# Patient Record
Sex: Female | Born: 1935 | Race: Black or African American | Hispanic: No | Smoking: Never smoker
Health system: Southern US, Community
[De-identification: ages and names within clinical notes are randomized; demographics above are authoritative.]

## PROBLEM LIST (undated history)

## (undated) DIAGNOSIS — I10 Essential (primary) hypertension: Secondary | ICD-10-CM

## (undated) DIAGNOSIS — I4510 Unspecified right bundle-branch block: Secondary | ICD-10-CM

## (undated) DIAGNOSIS — R011 Cardiac murmur, unspecified: Secondary | ICD-10-CM

## (undated) DIAGNOSIS — I2510 Atherosclerotic heart disease of native coronary artery without angina pectoris: Secondary | ICD-10-CM

## (undated) HISTORY — DX: Atherosclerotic heart disease of native coronary artery without angina pectoris: I25.10

## (undated) HISTORY — DX: Cardiac murmur, unspecified: R01.1

## (undated) HISTORY — DX: Unspecified right bundle-branch block: I45.10

## (undated) HISTORY — DX: Essential (primary) hypertension: I10

---
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
## 1998-11-01 ENCOUNTER — Ambulatory Visit (HOSPITAL_COMMUNITY): Admission: RE | Admit: 1998-11-01 | Discharge: 1998-11-01 | Payer: Self-pay | Admitting: Internal Medicine

## 1998-11-01 ENCOUNTER — Encounter: Payer: Self-pay | Admitting: Internal Medicine

## 1999-12-23 ENCOUNTER — Emergency Department (HOSPITAL_COMMUNITY): Admission: EM | Admit: 1999-12-23 | Discharge: 1999-12-23 | Payer: Self-pay | Admitting: Emergency Medicine

## 1999-12-23 ENCOUNTER — Encounter: Payer: Self-pay | Admitting: Emergency Medicine

## 2005-03-26 ENCOUNTER — Ambulatory Visit (HOSPITAL_COMMUNITY): Admission: RE | Admit: 2005-03-26 | Discharge: 2005-03-26 | Payer: Self-pay | Admitting: Orthopedic Surgery

## 2007-03-31 ENCOUNTER — Encounter: Admission: RE | Admit: 2007-03-31 | Discharge: 2007-03-31 | Payer: Self-pay | Admitting: Orthopedic Surgery

## 2007-04-10 ENCOUNTER — Encounter: Admission: RE | Admit: 2007-04-10 | Discharge: 2007-04-10 | Payer: Self-pay | Admitting: Orthopedic Surgery

## 2008-05-03 ENCOUNTER — Encounter: Admission: RE | Admit: 2008-05-03 | Discharge: 2008-05-03 | Payer: Self-pay | Admitting: Orthopedic Surgery

## 2010-11-19 ENCOUNTER — Encounter: Admission: RE | Admit: 2010-11-19 | Discharge: 2010-11-19 | Payer: Self-pay | Admitting: Internal Medicine

## 2012-02-03 ENCOUNTER — Other Ambulatory Visit: Payer: Self-pay | Admitting: Internal Medicine

## 2012-02-03 DIAGNOSIS — Z1231 Encounter for screening mammogram for malignant neoplasm of breast: Secondary | ICD-10-CM

## 2012-02-25 ENCOUNTER — Ambulatory Visit
Admission: RE | Admit: 2012-02-25 | Discharge: 2012-02-25 | Disposition: A | Discharge status: Home / Self Care | Payer: Medicare Other | Source: Ambulatory Visit | Attending: Internal Medicine | Admitting: Internal Medicine

## 2012-02-25 DIAGNOSIS — Z1231 Encounter for screening mammogram for malignant neoplasm of breast: Secondary | ICD-10-CM

## 2013-02-08 ENCOUNTER — Other Ambulatory Visit: Payer: Self-pay | Admitting: Internal Medicine

## 2013-02-08 DIAGNOSIS — Z1231 Encounter for screening mammogram for malignant neoplasm of breast: Secondary | ICD-10-CM

## 2013-03-17 ENCOUNTER — Ambulatory Visit: Payer: Medicare Other

## 2013-04-20 ENCOUNTER — Ambulatory Visit
Admission: RE | Admit: 2013-04-20 | Discharge: 2013-04-20 | Disposition: A | Discharge status: Home / Self Care | Payer: Self-pay | Source: Ambulatory Visit | Attending: Internal Medicine | Admitting: Internal Medicine

## 2013-04-20 DIAGNOSIS — Z1231 Encounter for screening mammogram for malignant neoplasm of breast: Secondary | ICD-10-CM

## 2013-08-10 ENCOUNTER — Encounter: Payer: Self-pay | Admitting: Cardiovascular Disease

## 2013-08-10 ENCOUNTER — Ambulatory Visit (INDEPENDENT_AMBULATORY_CARE_PROVIDER_SITE_OTHER): Payer: Medicare PPO | Admitting: Cardiovascular Disease

## 2013-08-10 VITALS — BP 134/76 | HR 55 | Ht 63.0 in | Wt 194.5 lb

## 2013-08-10 DIAGNOSIS — R001 Bradycardia, unspecified: Secondary | ICD-10-CM

## 2013-08-10 DIAGNOSIS — I498 Other specified cardiac arrhythmias: Secondary | ICD-10-CM

## 2013-08-10 NOTE — Progress Notes (Signed)
Patient ID: Allison Mills, female   DOB: [birth year], 79 y.o.   MRN: [id number]     Reason for office visit Followup bradycardia  Mrs. Soman is an elderly lady with sinus bradycardia chronic right bundle branch block. Roughly 2 years ago her heart rate was in the 40s and a recommended that she consider a pacemaker. She prefers to treat her condition with observation and she has not developed syncope or other symptoms of bradycardia. Her heart rate has generally been in the high 50s and low 60s. Today her heart rate is 55 beats per minute. She remains in sinus bradycardia. She has been started on eyedrops for glaucoma. She takes antihypertensive medications but is otherwise remarkably healthy. She has had some left shoulder pain that occasionally radiates to her chest, but is clearly associated with certain positions and motions and is musculoskeletal in etiology.  No Known Allergies  Current Outpatient Prescriptions  Medication Sig Dispense Refill  . brimonidine (ALPHAGAN P) 0.1 % SOLN Place 1 drop into both eyes 3 (three) times daily.      . brinzolamide (AZOPT) 1 % ophthalmic suspension Place 1 drop into both eyes 3 (three) times daily.      Marland Kitchen losartan-hydrochlorothiazide (HYZAAR) 100-12.5 MG per tablet Take 1 tablet by mouth daily.       No current facility-administered medications for this visit.    Past Medical History  Diagnosis Date  . RBBB (right bundle branch block)   . Coronary artery disease   . Hypertension   . Murmur     No past surgical history on file.  No family history on file.  History   Social History  . Marital Status: Widowed    Spouse Name: N/A    Number of Children: N/A  . Years of Education: N/A   Occupational History  . Not on file.   Social History Main Topics  . Smoking status: Never Smoker   . Smokeless tobacco: Never Used  . Alcohol Use: No  . Drug Use: No  . Sexual Activity: Not on file   Other Topics Concern  . Not on file    Social History Narrative  . No narrative on file    Review of systems: The patient specifically denies any chest pain at rest or with exertion, dyspnea at rest or with exertion, orthopnea, paroxysmal nocturnal dyspnea, syncope, palpitations, focal neurological deficits, intermittent claudication, lower extremity edema, unexplained weight gain, cough, hemoptysis or wheezing.  The patient also denies abdominal pain, nausea, vomiting, dysphagia, diarrhea, constipation, polyuria, polydipsia, dysuria, hematuria, frequency, urgency, abnormal bleeding or bruising, fever, chills, unexpected weight changes, mood swings, change in skin or hair texture, change in voice quality, auditory or visual problems, allergic reactions or rashes, new musculoskeletal complaints other than usual "aches and pains".   PHYSICAL EXAM BP 134/76  Pulse 55  Ht 5\' 3"  (1.6 m)  Wt 194 lb 8 oz (88.225 kg)  BMI 34.46 kg/m2  General: Alert, oriented x3, no distress Head: no evidence of trauma, PERRL, EOMI, no exophtalmos or lid lag, no myxedema, no xanthelasma; normal ears, nose and oropharynx Neck: normal jugular venous pulsations and no hepatojugular reflux; brisk carotid pulses without delay and no carotid bruits Chest: clear to auscultation, no signs of consolidation by percussion or palpation, normal fremitus, symmetrical and full respiratory excursions Cardiovascular: normal position and quality of the apical impulse, regular rhythm, normal first and second heart sounds, no rubs or gallops, early peaking grade 1/6 aortic ejection murmur Abdomen: no  tenderness or distention, no masses by palpation, no abnormal pulsatility or arterial bruits, normal bowel sounds, no hepatosplenomegaly Extremities: no clubbing, cyanosis or edema; 2+ radial, ulnar and brachial pulses bilaterally; 2+ right femoral, posterior tibial and dorsalis pedis pulses; 2+ left femoral, posterior tibial and dorsalis pedis pulses; no subclavian or  femoral bruits Neurological: grossly nonfocal   EKG: Sinus bradycardia, right bundle branch block (QRS 120 ms)   ASSESSMENT AND PLAN No problem-specific assessment & plan notes found for this encounter.  Orders Placed This Encounter  Procedures  . EKG 12-Lead   Meds ordered this encounter  Medications  . losartan-hydrochlorothiazide (HYZAAR) 100-12.5 MG per tablet    Sig: Take 1 tablet by mouth daily.  . brinzolamide (AZOPT) 1 % ophthalmic suspension    Sig: Place 1 drop into both eyes 3 (three) times daily.  . brimonidine (ALPHAGAN P) 0.1 % SOLN    Sig: Place 1 drop into both eyes 3 (three) times daily.    Junious Silk, MD, [facility] [phone] office [phone] pager

## 2013-08-10 NOTE — Patient Instructions (Addendum)
Your physician recommends that you schedule a follow-up appointment as needed if you develop loss of consciousness, dizziness or weakness.

## 2013-08-22 DIAGNOSIS — R001 Bradycardia, unspecified: Secondary | ICD-10-CM | POA: Insufficient documentation

## 2013-08-22 NOTE — Assessment & Plan Note (Signed)
Although there is convincing evidence of sinus node dysfunction and intraventricular conduction disease, Allison Mills and has not had any symptoms to suggest the need for pacemaker therapy. I have asked her to call me if she develops presyncope or syncope or if she develops marked fatigue or dyspnea. Otherwise I agree with her decision to monitor her condition without aggressive intervention.

## 2015-01-10 DIAGNOSIS — H4011X3 Primary open-angle glaucoma, severe stage: Secondary | ICD-10-CM | POA: Diagnosis not present

## 2015-01-10 DIAGNOSIS — H2512 Age-related nuclear cataract, left eye: Secondary | ICD-10-CM | POA: Diagnosis not present

## 2015-02-28 DIAGNOSIS — M150 Primary generalized (osteo)arthritis: Secondary | ICD-10-CM | POA: Diagnosis not present

## 2015-02-28 DIAGNOSIS — I10 Essential (primary) hypertension: Secondary | ICD-10-CM | POA: Diagnosis not present

## 2015-05-02 ENCOUNTER — Other Ambulatory Visit: Payer: Self-pay

## 2015-05-02 DIAGNOSIS — Z1231 Encounter for screening mammogram for malignant neoplasm of breast: Secondary | ICD-10-CM

## 2015-05-25 ENCOUNTER — Ambulatory Visit
Admission: RE | Admit: 2015-05-25 | Discharge: 2015-05-25 | Disposition: A | Discharge status: Home / Self Care | Payer: Medicare PPO | Source: Ambulatory Visit

## 2015-05-25 DIAGNOSIS — Z1231 Encounter for screening mammogram for malignant neoplasm of breast: Secondary | ICD-10-CM

## 2015-06-27 DIAGNOSIS — R739 Hyperglycemia, unspecified: Secondary | ICD-10-CM | POA: Diagnosis not present

## 2015-06-27 DIAGNOSIS — E559 Vitamin D deficiency, unspecified: Secondary | ICD-10-CM | POA: Diagnosis not present

## 2015-06-27 DIAGNOSIS — M150 Primary generalized (osteo)arthritis: Secondary | ICD-10-CM | POA: Diagnosis not present

## 2015-06-27 DIAGNOSIS — I10 Essential (primary) hypertension: Secondary | ICD-10-CM | POA: Diagnosis not present

## 2015-06-27 DIAGNOSIS — M2550 Pain in unspecified joint: Secondary | ICD-10-CM | POA: Diagnosis not present

## 2015-07-11 DIAGNOSIS — H16223 Keratoconjunctivitis sicca, not specified as Sjogren's, bilateral: Secondary | ICD-10-CM | POA: Diagnosis not present

## 2015-07-11 DIAGNOSIS — H43813 Vitreous degeneration, bilateral: Secondary | ICD-10-CM | POA: Diagnosis not present

## 2015-07-11 DIAGNOSIS — H47323 Drusen of optic disc, bilateral: Secondary | ICD-10-CM | POA: Diagnosis not present

## 2015-07-11 DIAGNOSIS — H4011X3 Primary open-angle glaucoma, severe stage: Secondary | ICD-10-CM | POA: Diagnosis not present

## 2015-08-01 DIAGNOSIS — I10 Essential (primary) hypertension: Secondary | ICD-10-CM | POA: Diagnosis not present

## 2015-08-01 DIAGNOSIS — M150 Primary generalized (osteo)arthritis: Secondary | ICD-10-CM | POA: Diagnosis not present

## 2015-11-28 DIAGNOSIS — I10 Essential (primary) hypertension: Secondary | ICD-10-CM | POA: Diagnosis not present

## 2015-11-28 DIAGNOSIS — M150 Primary generalized (osteo)arthritis: Secondary | ICD-10-CM | POA: Diagnosis not present

## 2015-11-28 DIAGNOSIS — M1990 Unspecified osteoarthritis, unspecified site: Secondary | ICD-10-CM | POA: Diagnosis not present

## 2015-11-28 DIAGNOSIS — Z23 Encounter for immunization: Secondary | ICD-10-CM | POA: Diagnosis not present

## 2015-11-28 DIAGNOSIS — E559 Vitamin D deficiency, unspecified: Secondary | ICD-10-CM | POA: Diagnosis not present

## 2015-11-28 DIAGNOSIS — M154 Erosive (osteo)arthritis: Secondary | ICD-10-CM | POA: Diagnosis not present

## 2016-07-23 ENCOUNTER — Other Ambulatory Visit: Payer: Self-pay | Admitting: Internal Medicine

## 2016-07-23 DIAGNOSIS — Z1231 Encounter for screening mammogram for malignant neoplasm of breast: Secondary | ICD-10-CM

## 2016-08-01 ENCOUNTER — Ambulatory Visit
Admission: RE | Admit: 2016-08-01 | Discharge: 2016-08-01 | Disposition: A | Discharge status: Home / Self Care | Payer: Medicare Other | Source: Ambulatory Visit | Attending: Internal Medicine | Admitting: Internal Medicine

## 2016-08-01 DIAGNOSIS — Z1231 Encounter for screening mammogram for malignant neoplasm of breast: Secondary | ICD-10-CM

## 2017-05-28 LAB — HM DIABETES EYE EXAM

## 2018-02-03 LAB — HM DIABETES EYE EXAM

## 2018-09-02 LAB — HM DIABETES EYE EXAM

## 2018-09-14 ENCOUNTER — Ambulatory Visit
Admission: RE | Admit: 2018-09-14 | Discharge: 2018-09-14 | Disposition: A | Discharge status: Home / Self Care | Payer: Medicare Other | Source: Ambulatory Visit | Attending: Internal Medicine | Admitting: Internal Medicine

## 2018-09-14 ENCOUNTER — Other Ambulatory Visit: Payer: Self-pay | Admitting: Internal Medicine

## 2018-09-14 DIAGNOSIS — G8929 Other chronic pain: Secondary | ICD-10-CM

## 2018-09-14 DIAGNOSIS — M25552 Pain in left hip: Secondary | ICD-10-CM

## 2018-09-14 DIAGNOSIS — M533 Sacrococcygeal disorders, not elsewhere classified: Secondary | ICD-10-CM

## 2018-09-14 DIAGNOSIS — M898X5 Other specified disorders of bone, thigh: Secondary | ICD-10-CM

## 2018-09-14 DIAGNOSIS — M79605 Pain in left leg: Secondary | ICD-10-CM

## 2018-09-24 ENCOUNTER — Telehealth: Payer: Self-pay | Admitting: Internal Medicine

## 2018-09-24 NOTE — Telephone Encounter (Signed)
I spoke with her daughter today.

## 2018-10-19 ENCOUNTER — Telehealth: Payer: Self-pay | Admitting: Internal Medicine

## 2018-10-19 NOTE — Telephone Encounter (Signed)
I left a message with the patient's daughter asking her to come at 11:30 instead of 11:15 on 11/04/18. VDM (DD)

## 2018-11-04 ENCOUNTER — Ambulatory Visit: Payer: Self-pay | Admitting: Internal Medicine

## 2018-11-04 ENCOUNTER — Ambulatory Visit (INDEPENDENT_AMBULATORY_CARE_PROVIDER_SITE_OTHER): Payer: Medicare Other | Admitting: Internal Medicine

## 2018-11-04 ENCOUNTER — Encounter: Payer: Self-pay | Admitting: Internal Medicine

## 2018-11-04 ENCOUNTER — Ambulatory Visit: Payer: Medicare Other

## 2018-11-04 VITALS — BP 130/70 | HR 56 | Temp 97.9°F | Ht 61.0 in | Wt 150.2 lb

## 2018-11-04 DIAGNOSIS — Z23 Encounter for immunization: Secondary | ICD-10-CM | POA: Diagnosis not present

## 2018-11-04 DIAGNOSIS — E559 Vitamin D deficiency, unspecified: Secondary | ICD-10-CM | POA: Diagnosis not present

## 2018-11-04 DIAGNOSIS — I10 Essential (primary) hypertension: Secondary | ICD-10-CM

## 2018-11-04 DIAGNOSIS — Z0000 Encounter for general adult medical examination without abnormal findings: Secondary | ICD-10-CM | POA: Diagnosis not present

## 2018-11-04 DIAGNOSIS — R635 Abnormal weight gain: Secondary | ICD-10-CM

## 2018-11-04 NOTE — Patient Instructions (Signed)
[name] , Thank you for taking time to come for your Medicare Wellness Visit. I appreciate your ongoing commitment to your health goals. Please review the following plan we discussed and let me know if I can assist you in the future.   Screening recommendations/referrals: Colonoscopy: not required Mammogram: not required Bone Density: 10/2010 Recommended yearly ophthalmology/optometry visit for glaucoma screening and checkup Recommended yearly dental visit for hygiene and checkup  Vaccinations: Influenza vaccine: today Pneumococcal vaccine: checking with pharmacy Tdap vaccine: dec Shingles vaccine: dec    Advanced directives: Advance directive discussed with you today. I have provided a copy for you to complete at home and have notarized. Once this is complete please bring a copy in to our office so we can scan it into your chart.   Conditions/risks identified: Overweight  Next appointment: 03/10/2019 at 11:00   Preventive Care 65 Years and Older, Female Preventive care refers to lifestyle choices and visits with your health care provider that can promote health and wellness. What does preventive care include?  A yearly physical exam. This is also called an annual well check.  Dental exams once or twice a year.  Routine eye exams. Ask your health care provider how often you should have your eyes checked.  Personal lifestyle choices, including:  Daily care of your teeth and gums.  Regular physical activity.  Eating a healthy diet.  Avoiding tobacco and drug use.  Limiting alcohol use.  Practicing safe sex.  Taking low-dose aspirin every day.  Taking vitamin and mineral supplements as recommended by your health care provider. What happens during an annual well check? The services and screenings done by your health care provider during your annual well check will depend on your age, overall health, lifestyle risk factors, and family history of disease. Counseling    Your health care provider may ask you questions about your:  Alcohol use.  Tobacco use.  Drug use.  Emotional well-being.  Home and relationship well-being.  Sexual activity.  Eating habits.  History of falls.  Memory and ability to understand (cognition).  Work and work Astronomerenvironment.  Reproductive health. Screening  You may have the following tests or measurements:  Height, weight, and BMI.  Blood pressure.  Lipid and cholesterol levels. These may be checked every 5 years, or more frequently if you are over 82 years old.  Skin check.  Lung cancer screening. You may have this screening every year starting at age 82 if you have a 30-pack-year history of smoking and currently smoke or have quit within the past 15 years.  Fecal occult blood test (FOBT) of the stool. You may have this test every year starting at age 82.  Flexible sigmoidoscopy or colonoscopy. You may have a sigmoidoscopy every 5 years or a colonoscopy every 10 years starting at age 82.  Hepatitis C blood test.  Hepatitis B blood test.  Sexually transmitted disease (STD) testing.  Diabetes screening. This is done by checking your blood sugar (glucose) after you have not eaten for a while (fasting). You may have this done every 1-3 years.  Bone density scan. This is done to screen for osteoporosis. You may have this done starting at age 82.  Mammogram. This may be done every 1-2 years. Talk to your health care provider about how often you should have regular mammograms. Talk with your health care provider about your test results, treatment options, and if necessary, the need for more tests. Vaccines  Your health care provider may recommend  certain vaccines, such as:  Influenza vaccine. This is recommended every year.  Tetanus, diphtheria, and acellular pertussis (Tdap, Td) vaccine. You may need a Td booster every 10 years.  Zoster vaccine. You may need this after age 9.  Pneumococcal 13-valent  conjugate (PCV13) vaccine. One dose is recommended after age 81.  Pneumococcal polysaccharide (PPSV23) vaccine. One dose is recommended after age 45. Talk to your health care provider about which screenings and vaccines you need and how often you need them. This information is not intended to replace advice given to you by your health care provider. Make sure you discuss any questions you have with your health care provider. Document Released: 01/04/2016 Document Revised: 08/27/2016 Document Reviewed: 10/09/2015 Elsevier Interactive Patient Education  2017 [employer] Prevention in the Home Falls can cause injuries. They can happen to people of all ages. There are many things you can do to make your home safe and to help prevent falls. What can I do on the outside of my home?  Regularly fix the edges of walkways and driveways and fix any cracks.  Remove anything that might make you trip as you walk through a door, such as a raised step or threshold.  Trim any bushes or trees on the path to your home.  Use bright outdoor lighting.  Clear any walking paths of anything that might make someone trip, such as rocks or tools.  Regularly check to see if handrails are loose or broken. Make sure that both sides of any steps have handrails.  Any raised decks and porches should have guardrails on the edges.  Have any leaves, snow, or ice cleared regularly.  Use sand or salt on walking paths during winter.  Clean up any spills in your garage right away. This includes oil or grease spills. What can I do in the bathroom?  Use night lights.  Install grab bars by the toilet and in the tub and shower. Do not use towel bars as grab bars.  Use non-skid mats or decals in the tub or shower.  If you need to sit down in the shower, use a plastic, non-slip stool.  Keep the floor dry. Clean up any water that spills on the floor as soon as it happens.  Remove soap buildup in the tub or  shower regularly.  Attach bath mats securely with double-sided non-slip rug tape.  Do not have throw rugs and other things on the floor that can make you trip. What can I do in the bedroom?  Use night lights.  Make sure that you have a light by your bed that is easy to reach.  Do not use any sheets or blankets that are too big for your bed. They should not hang down onto the floor.  Have a firm chair that has side arms. You can use this for support while you get dressed.  Do not have throw rugs and other things on the floor that can make you trip. What can I do in the kitchen?  Clean up any spills right away.  Avoid walking on wet floors.  Keep items that you use a lot in easy-to-reach places.  If you need to reach something above you, use a strong step stool that has a grab bar.  Keep electrical cords out of the way.  Do not use floor polish or wax that makes floors slippery. If you must use wax, use non-skid floor wax.  Do not have throw rugs and other  things on the floor that can make you trip. What can I do with my stairs?  Do not leave any items on the stairs.  Make sure that there are handrails on both sides of the stairs and use them. Fix handrails that are broken or loose. Make sure that handrails are as long as the stairways.  Check any carpeting to make sure that it is firmly attached to the stairs. Fix any carpet that is loose or worn.  Avoid having throw rugs at the top or bottom of the stairs. If you do have throw rugs, attach them to the floor with carpet tape.  Make sure that you have a light switch at the top of the stairs and the bottom of the stairs. If you do not have them, ask someone to add them for you. What else can I do to help prevent falls?  Wear shoes that:  Do not have high heels.  Have rubber bottoms.  Are comfortable and fit you well.  Are closed at the toe. Do not wear sandals.  If you use a stepladder:  Make sure that it is fully  opened. Do not climb a closed stepladder.  Make sure that both sides of the stepladder are locked into place.  Ask someone to hold it for you, if possible.  Clearly mark and make sure that you can see:  Any grab bars or handrails.  First and last steps.  Where the edge of each step is.  Use tools that help you move around (mobility aids) if they are needed. These include:  Canes.  Walkers.  Scooters.  Crutches.  Turn on the lights when you go into a dark area. Replace any light bulbs as soon as they burn out.  Set up your furniture so you have a clear path. Avoid moving your furniture around.  If any of your floors are uneven, fix them.  If there are any pets around you, be aware of where they are.  Review your medicines with your doctor. Some medicines can make you feel dizzy. This can increase your chance of falling. Ask your doctor what other things that you can do to help prevent falls. This information is not intended to replace advice given to you by your health care provider. Make sure you discuss any questions you have with your health care provider. Document Released: 10/04/2009 Document Revised: 05/15/2016 Document Reviewed: 01/12/2015 Elsevier Interactive Patient Education  2017 [employer].

## 2018-11-04 NOTE — Progress Notes (Signed)
Subjective:   Allison Mills is a 84 y.o. female who presents for an Initial Medicare Annual Wellness Visit.  Review of Systems    n/a  Cardiac Risk Factors include: advanced age (>22men, >24 women);hypertension     Objective:    Today's Vitals   11/04/18 1155  BP: 130/70  Pulse: (!) 56  Temp: 97.9 F (36.6 C)  SpO2: 99%  Weight: 150 lb 3.2 oz (68.1 kg)  Height: 5\' 1"  (1.549 m)  PainSc: 0-No pain   Body mass index is 28.38 kg/m.  Advanced Directives 11/04/2018  Does Patient Have a Medical Advance Directive? No  Would patient like information on creating a medical advance directive? Yes (MAU/Ambulatory/Procedural Areas - Information given)    Current Medications (verified) Outpatient Encounter Medications as of 11/04/2018  Medication Sig  . amLODipine (NORVASC) 5 MG tablet Take 5 mg by mouth daily.  . brimonidine (ALPHAGAN P) 0.1 % SOLN Place 1 drop into both eyes 3 (three) times daily.  . brinzolamide (AZOPT) 1 % ophthalmic suspension Place 1 drop into both eyes 3 (three) times daily.  Marland Kitchen latanoprost (XALATAN) 0.005 % ophthalmic solution 1 drop at bedtime.  . valsartan-hydrochlorothiazide (DIOVAN-HCT) 160-12.5 MG tablet Take 1 tablet by mouth daily.  Marland Kitchen losartan-hydrochlorothiazide (HYZAAR) 100-12.5 MG per tablet Take 1 tablet by mouth daily.   No facility-administered encounter medications on file as of 11/04/2018.     Allergies (verified) Patient has no known allergies.   History: Past Medical History:  Diagnosis Date  . Coronary artery disease   . Hypertension   . Murmur   . RBBB (right bundle branch block)    History reviewed. No pertinent surgical history. History reviewed. No pertinent family history. Social History   Socioeconomic History  . Marital status: Widowed    Spouse name: Not on file  . Number of children: Not on file  . Years of education: Not on file  . Highest education level: Not on file  Occupational History  . Occupation:  retired  Engineer, production  . Financial resource strain: Not hard at all  . Food insecurity:    Worry: Never true    Inability: Never true  . Transportation needs:    Medical: No    Non-medical: No  Tobacco Use  . Smoking status: Never Smoker  . Smokeless tobacco: Never Used  Substance and Sexual Activity  . Alcohol use: No  . Drug use: No  . Sexual activity: Not Currently  Lifestyle  . Physical activity:    Days per week: 3 days    Minutes per session: 30 min  . Stress: Not at all  Relationships  . Social connections:    Talks on phone: Not on file    Gets together: Not on file    Attends religious service: Not on file    Active member of club or organization: Not on file    Attends meetings of clubs or organizations: Not on file    Relationship status: Not on file  Other Topics Concern  . Not on file  Social History Narrative  . Not on file    Tobacco Counseling Counseling given: Not Answered   Clinical Intake:  Pre-visit preparation completed: Yes  Pain : No/denies pain Pain Score: 0-No pain     Nutritional Status: BMI 25 -29 Overweight Nutritional Risks: None Diabetes: No  How often do you need to have someone help you when you read instructions, pamphlets, or other written materials from your doctor or  pharmacy?: 1 - Never What is the last grade level you completed in school?: graduated high school  Interpreter Needed?: No  Information entered by :: NAllen LPN   Activities of Daily Living In your present state of health, do you have any difficulty performing the following activities: 11/04/2018  Hearing? Y  Comment working on getting hearing aides adjusted  Vision? N  Difficulty concentrating or making decisions? N  Walking or climbing stairs? N  Dressing or bathing? N  Doing errands, shopping? Y  Comment lives in independent living  Preparing Food and eating ? N  Using the Toilet? N  In the past six months, have you accidently leaked urine? N    Do you have problems with loss of bowel control? N  Managing your Medications? N  Managing your Finances? N  Housekeeping or managing your Housekeeping? N  Some recent data might be hidden     Immunizations and Health Maintenance Immunization History  Administered Date(s) Administered  . Influenza, High Dose Seasonal PF 11/04/2018   Health Maintenance Due  Topic Date Due  . TETANUS/TDAP  11-06-201938  . PNA vac Low Risk Adult (1 of 2 - PCV13) 11-06-201984    Patient Care Team: Dorothyann Peng, MD as PCP - General (Internal Medicine)  Indicate any recent Medical Services you may have received from other than [facility] providers in the past year (date may be approximate).     Assessment:   This is a routine wellness examination for Jewett.  Hearing/Vision screen Vision Screening Comments: Dr. Harlon Flor Annual eye exams  Dietary issues and exercise activities discussed: Current Exercise Habits: Structured exercise class, Type of exercise: walking;strength training/weights, Time (Minutes): 30, Frequency (Times/Week): 3, Weekly Exercise (Minutes/Week): 90, Intensity: Moderate, Exercise limited by: None identified  Goals    . DIET - REDUCE SALT INTAKE TO 2 GRAMS PER DAY OR LESS (pt-stated)     Does not want to eat salty foods      Depression Screen PHQ 2/9 Scores 11/04/2018  PHQ - 2 Score 0    Fall Risk Fall Risk  11/04/2018  Falls in the past year? 0  Risk for fall due to : Medication side effect    Is the patient's home free of loose throw rugs in walkways, pet beds, electrical cords, etc?   yes      Grab bars in the bathroom? yes      Handrails on the stairs?   yes      Adequate lighting?   yes  Timed Get Up and Go Performed n/a  Cognitive Function:     6CIT Screen 11/04/2018  What Year? 0 points  What month? 0 points  What time? 3 points  Count back from 20 0 points  Months in reverse 0 points  Repeat phrase 2 points  Total Score 5    Screening Tests Health  Maintenance  Topic Date Due  . TETANUS/TDAP  11-06-201938  . PNA vac Low Risk Adult (1 of 2 - PCV13) 11-06-201984  . INFLUENZA VACCINE  Completed  . DEXA SCAN  Completed    Qualifies for Shingles Vaccine? yes  Cancer Screenings: Lung: Low Dose CT Chest recommended if Age 58-80 years, 30 pack-year currently smoking OR have quit w/in 15years. Patient does not qualify. Breast: Up to date on Mammogram? Yes   Up to date of Bone Density/Dexa? Yes Colorectal: not required  Additional Screenings:  Hepatitis C Screening: n/a     Plan:    Patient states she does  not want to eat salty foods. Received flu vaccine today. Checking with pharmacy in regards to pneumonia vaccines.  I have personally reviewed and noted the following in the patient's chart:   . Medical and social history . Use of alcohol, tobacco or illicit drugs  . Current medications and supplements . Functional ability and status . Nutritional status . Physical activity . Advanced directives . List of other physicians . Hospitalizations, surgeries, and ER visits in previous 12 months . Vitals . Screenings to include cognitive, depression, and falls . Referrals and appointments  In addition, I have reviewed and discussed with patient certain preventive protocols, quality metrics, and best practice recommendations. A written personalized care plan for preventive services as well as general preventive health recommendations were provided to patient.     Barb Merinoickeah E Ananias Kolander, LPN   [id number]/14/2019

## 2018-11-04 NOTE — Progress Notes (Addendum)
Subjective:     Patient ID: Allison Mills , female    DOB: 1916-04-29 , 84 y.o.   MRN: [id number]   Chief Complaint  Patient presents with  . Hypertension    HPI Pt is here for HTN FU  She is now Living at [facility] facility and she has adjusted well, she gets 2 meals a day and help her clean her home. Her daughter who is with her was concerned about pt loosing weight a few months ago, but pt states she eats well at her new facility. Has been doing PT 2-3 / week and then she goes on her own to the gym to do her exercises. She still has to use her walker, but her pain is better.    Past Medical History:  Diagnosis Date  . Coronary artery disease   . Hypertension   . Murmur   . RBBB (right bundle branch block)      No family history on file.   Current Outpatient Medications:  .  amLODipine (NORVASC) 5 MG tablet, Take 5 mg by mouth daily., Disp: , Rfl:  .  brimonidine (ALPHAGAN P) 0.1 % SOLN, Place 1 drop into both eyes 3 (three) times daily., Disp: , Rfl:  .  brinzolamide (AZOPT) 1 % ophthalmic suspension, Place 1 drop into both eyes 3 (three) times daily., Disp: , Rfl:  .  latanoprost (XALATAN) 0.005 % ophthalmic solution, 1 drop at bedtime., Disp: , Rfl:  .  losartan-hydrochlorothiazide (HYZAAR) 100-12.5 MG per tablet, Take 1 tablet by mouth daily., Disp: , Rfl:  .  valsartan-hydrochlorothiazide (DIOVAN-HCT) 160-12.5 MG tablet, Take 1 tablet by mouth daily., Disp: , Rfl:    No Known Allergies   Review of Systems  Constitutional: Negative for activity change, appetite change, diaphoresis, fatigue and fever.  Eyes: Negative for discharge.  Respiratory: Negative for shortness of breath.   Cardiovascular: Positive for leg swelling. Negative for chest pain and palpitations.       Gets ankle swelling at the end of the day  Gastrointestinal: Negative for abdominal distention, abdominal pain, constipation, diarrhea and nausea.  Endocrine: Negative for polydipsia and polyphagia.   Genitourinary: Negative for dysuria, enuresis, frequency and urgency.  Musculoskeletal: Positive for arthralgias and gait problem.       See HPI  Skin: Negative for rash.  Neurological: Negative for dizziness, weakness and headaches.  Psychiatric/Behavioral: Negative for sleep disturbance.     Today's Vitals   11/04/18 1240  BP: 130/70  Pulse: (!) 56  Temp: 97.9 F (36.6 C)  TempSrc: Oral  SpO2: 99%  Weight: 150 lb 3.2 oz (68.1 kg)  Height: 5\' 1"  (1.549 m)   Body mass index is 28.38 kg/m.   Objective:  Physical Exam   Constitutional: She is oriented to person, place, and time. She appears well-developed and well-nourished. No distress.  HENT:  Head: Normocephalic and atraumatic.  Right Ear: External ear normal.  Left Ear: External ear normal.  Nose: Nose normal.  Eyes: Conjunctivae are normal. Right eye exhibits no discharge. Left eye exhibits no discharge. No scleral icterus.  Neck: Neck supple. No thyromegaly present.  No carotid bruits bilaterally  Cardiovascular: Normal rate and regular rhythm.  No murmur heard. Pulmonary/Chest: Effort normal and breath sounds normal. No respiratory distress.  Musculoskeletal: Normal range of motion. She exhibits +1/4 pitting edema of her L ankle laterally.  Lymphadenopathy:    She has no cervical adenopathy.  Neurological: She is alert and oriented to person, place, and time.  Skin: Skin is warm and dry. Capillary refill takes less than 2 seconds. No rash noted. She is not diaphoretic.  Psychiatric: She has a normal mood and affect. Her behavior is normal. Judgment and thought content normal.  Nursing note reviewed.     Assessment And Plan:     1. Vitamin D deficiency chronic - Vitamin D 1,25 dihydroxy  2. Essential hypertension- stable, may continue same meds - CBC no Diff - CMP14 + Anion Gap - Lipid Profile - TSH - T3, free - T4, Free    FU in 3 months.   Shelma Eiben RODRIGUEZ-SOUTHWORTH, PA-C

## 2018-11-05 LAB — T3, FREE: T3 FREE: 2.2 pg/mL (ref 2.0–4.4)

## 2018-11-05 LAB — LIPID PANEL
CHOLESTEROL TOTAL: 146 mg/dL (ref 100–199)
Chol/HDL Ratio: 2.1 ratio (ref 0.0–4.4)
HDL: 68 mg/dL (ref >39)
LDL Calculated: 66 mg/dL (ref 0–99)
TRIGLYCERIDES: 58 mg/dL (ref 0–149)
VLDL CHOLESTEROL CAL: 12 mg/dL (ref 5–40)

## 2018-11-05 LAB — CMP14 + ANION GAP
ALK PHOS: 58 IU/L (ref 39–117)
ALT: 14 IU/L (ref 0–32)
ANION GAP: 13 mmol/L (ref 10.0–18.0)
AST: 14 IU/L (ref 0–40)
Albumin/Globulin Ratio: 1.5 (ref 1.2–2.2)
Albumin: 3.7 g/dL (ref 3.2–4.6)
BILIRUBIN TOTAL: 0.4 mg/dL (ref 0.0–1.2)
BUN/Creatinine Ratio: 20 (ref 12–28)
BUN: 16 mg/dL (ref 10–36)
CHLORIDE: 101 mmol/L (ref 96–106)
CO2: 26 mmol/L (ref 20–29)
CREATININE: 0.8 mg/dL (ref 0.57–1.00)
Calcium: 9.8 mg/dL (ref 8.7–10.3)
GFR calc Af Amer: 70 mL/min/{1.73_m2} (ref >59)
GFR calc non Af Amer: 61 mL/min/{1.73_m2} (ref >59)
GLUCOSE: 91 mg/dL (ref 65–99)
Globulin, Total: 2.4 g/dL (ref 1.5–4.5)
Potassium: 3.9 mmol/L (ref 3.5–5.2)
Sodium: 140 mmol/L (ref 134–144)
Total Protein: 6.1 g/dL (ref 6.0–8.5)

## 2018-11-05 LAB — CBC
HEMATOCRIT: 36.4 % (ref 34.0–46.6)
Hemoglobin: 12.3 g/dL (ref 11.1–15.9)
MCH: 31 pg (ref 26.6–33.0)
MCHC: 33.8 g/dL (ref 31.5–35.7)
MCV: 92 fL (ref 79–97)
Platelets: 218 10*3/uL (ref 150–450)
RBC: 3.97 x10E6/uL (ref 3.77–5.28)
RDW: 13 % (ref 12.3–15.4)
WBC: 4 10*3/uL (ref 3.4–10.8)

## 2018-11-05 LAB — TSH: TSH: 2.61 u[IU]/mL (ref 0.450–4.500)

## 2018-11-05 LAB — VITAMIN D 25 HYDROXY (VIT D DEFICIENCY, FRACTURES): Vit D, 25-Hydroxy: 38.9 ng/mL (ref 30.0–100.0)

## 2018-11-22 ENCOUNTER — Other Ambulatory Visit: Payer: Self-pay | Admitting: Internal Medicine

## 2018-11-27 LAB — SPECIMEN STATUS REPORT

## 2018-11-27 LAB — T4, FREE: Free T4: 1.33 ng/dL (ref 0.82–1.77)

## 2019-03-03 ENCOUNTER — Encounter: Payer: Self-pay | Admitting: Internal Medicine

## 2019-03-03 ENCOUNTER — Ambulatory Visit: Payer: Medicare Other | Admitting: Internal Medicine

## 2019-03-03 ENCOUNTER — Other Ambulatory Visit: Payer: Self-pay

## 2019-03-03 VITALS — BP 132/70 | HR 55 | Temp 98.1°F | Ht 65.0 in | Wt 168.8 lb

## 2019-03-03 DIAGNOSIS — G8929 Other chronic pain: Secondary | ICD-10-CM

## 2019-03-03 DIAGNOSIS — N182 Chronic kidney disease, stage 2 (mild): Secondary | ICD-10-CM | POA: Diagnosis not present

## 2019-03-03 DIAGNOSIS — R600 Localized edema: Secondary | ICD-10-CM

## 2019-03-03 DIAGNOSIS — M25562 Pain in left knee: Secondary | ICD-10-CM | POA: Diagnosis not present

## 2019-03-03 DIAGNOSIS — I129 Hypertensive chronic kidney disease with stage 1 through stage 4 chronic kidney disease, or unspecified chronic kidney disease: Secondary | ICD-10-CM | POA: Diagnosis not present

## 2019-03-03 MED ORDER — AMLODIPINE BESYLATE 5 MG PO TABS: 5.0000 mg | ORAL_TABLET | Freq: Every day | ORAL | 1 refills | Status: DC

## 2019-03-03 NOTE — Patient Instructions (Signed)
Understanding Your Risk for Falls Each year, millions of people suffer serious injuries from falls. It is important to understand your risk for falling. Talk with your health care provider about your risk and what you can do to lower it. There are actions you can take at home to lower your risk. If you do have a serious fall, it is important to tell your health care provider. Falling once raises your risk for falling again. How can falls affect me? Serious injuries from falls are common. These include:  Broken bones. Most hip fractures are caused by falls.  Traumatic brain injury (TBI). Falls are the most common cause of TBI. Fear of falling can also cause you to avoid activities and stay at home. This can make your muscles weaker and actually raise your risk for a fall. What can increase my risk? Serious injuries from a fall most often happen to people older than age 65. Children and young adults ages 15-29 are also at higher risk. The more risk factors you have for falling, the higher your risk. Risk factors include:  Weakness in the lower body.  Lack (deficiency) of vitamin D.  Weak bones (osteoporosis).  Being generally weak or confused due to long-term (chronic) illness.  Dizziness or balance problems.  Poor vision.  Having depression.  Medicine that causes dizziness or drowsiness. These can include medicines for your blood pressure, heart, anxiety, insomnia, or edema, as well as pain medicines and muscle relaxants.  Drinking alcohol.  Foot pain or improper footwear.  Working at a dangerous job.  Having had a fall in the past.  Tripping hazards at home, such as floor clutter or loose rugs, or poor lighting.  Having pets or clutter in your home. What actions can I take to lower my risk of falling?      Maintain physical fitness: ? Do strength and balance exercises. Consider taking a regular class to build strength and balance. Yoga and tai chi are good options. ?  Have your eyes checked every year and your vision prescription updated as needed.  Remove all clutter from walkways and stairways, including extension cords.  Use a cordless phone.  Do not use throw rugs. Make sure all carpeting is taped or tacked down securely.  Use good lighting in all rooms. Keep a flashlight near your bed.  Make sure there is a clear path from your bed to the bathroom. Use night-lights.  Install grab bars for your tub, shower, and toilet. Use a bath mat in your tub or shower.  Attach secure railings on both sides of your stairs.  Repair uneven or broken steps.  Use a cane or walker as directed by your health care provider.  Wear nonskid shoes. Do not wear high heels. Do not walk around the house in socks or slippers.  Avoid walking on icy or slippery surfaces. Walk on the grass instead of on icy or slick sidewalks. Where you can, use ice melt to get rid of ice on walkways. Questions to ask your health care provider  Can you help me evaluate my risk for a fall?  Do any of my medicines make me more likely to fall?  Should I take a vitamin D supplement?  What exercises can I do to improve my strength and balance?  Should I make an appointment to have my vision checked?  Do I need a bone density test to check for osteoporosis?  Would it help to use a cane or a walker? Where   What exercises can I do to improve my strength and balance?   Should I make an appointment to have my vision checked?   Do I need a bone density test to check for osteoporosis?   Would it help to use a cane or a walker?  Where to find more information   Centers for Disease Control and Prevention, STEADI: cdc.gov   Community-Based Fall Prevention Programs: cdc.gov   National Institute on Aging: go4life.nia.nih.gov  Contact a health care provider if:   You fall at home.   You are afraid of falling at home.   You feel weak, drowsy, or dizzy at home.  Summary   People 65 and older are at high risk for falling. However, older people are not the only ones injured in falls. Children and young adults have a higher-than-normal risk, too.   Talk with your health care provider about your risks for falling  and how to lower those risks.   Taking certain precautions at home can lower your risk for falling.   If you fall, always tell your health care provider.  This information is not intended to replace advice given to you by your health care provider. Make sure you discuss any questions you have with your health care provider.  Document Released: 07/22/2017 Document Revised: 07/22/2017 Document Reviewed: 07/22/2017  Elsevier Interactive Patient Education  2019 Elsevier Inc.

## 2019-03-04 LAB — BMP8+EGFR
BUN / CREAT RATIO: 25 (ref 12–28)
BUN: 18 mg/dL (ref 10–36)
CO2: 24 mmol/L (ref 20–29)
CREATININE: 0.71 mg/dL (ref 0.57–1.00)
Calcium: 9.8 mg/dL (ref 8.7–10.3)
Chloride: 105 mmol/L (ref 96–106)
GFR calc Af Amer: 81 mL/min/{1.73_m2} (ref >59)
GFR, EST NON AFRICAN AMERICAN: 70 mL/min/{1.73_m2} (ref >59)
GLUCOSE: 77 mg/dL (ref 65–99)
Potassium: 4.3 mmol/L (ref 3.5–5.2)
SODIUM: 144 mmol/L (ref 134–144)

## 2019-03-10 ENCOUNTER — Ambulatory Visit: Payer: Medicare Other | Admitting: Internal Medicine

## 2019-03-21 NOTE — Progress Notes (Signed)
Subjective:     Patient ID: Allison Mills , female    DOB: 1916-10-16 , 85 y.o.   MRN: [id number]   Chief Complaint  Patient presents with  . Hypertension  . Edema    HPI  She is here today for bp check.  She is accompanied by her daughter today. She reports compliance with medications.   Hypertension  This is a chronic problem. The current episode started more than 1 year ago. The problem has been gradually improving since onset. The problem is controlled. Associated symptoms include peripheral edema. Pertinent negatives include no blurred vision or neck pain.     Past Medical History:  Diagnosis Date  . Coronary artery disease   . Hypertension   . Murmur   . RBBB (right bundle branch block)      Family History  Problem Relation Age of Onset  . Uterine cancer Mother   . Healthy Father   . Heart Problems Other      Current Outpatient Medications:  .  amLODipine (NORVASC) 5 MG tablet, Take 1 tablet (5 mg total) by mouth daily., Disp: 90 tablet, Rfl: 1 .  brimonidine (ALPHAGAN P) 0.1 % SOLN, Place 1 drop into both eyes 3 (three) times daily., Disp: , Rfl:  .  brinzolamide (AZOPT) 1 % ophthalmic suspension, Place 1 drop into both eyes 3 (three) times daily., Disp: , Rfl:  .  Cholecalciferol (VITAMIN D3) 50 MCG (2000 UT) capsule, Take 2,000 Units by mouth daily., Disp: , Rfl:  .  latanoprost (XALATAN) 0.005 % ophthalmic solution, 1 drop at bedtime., Disp: , Rfl:  .  Multiple Vitamins-Minerals (SENIOR MULTIVITAMIN PLUS PO), Take by mouth., Disp: , Rfl:  .  valsartan-hydrochlorothiazide (DIOVAN-HCT) 160-12.5 MG tablet, TAKE 1 TABLET BY MOUTH EVERY DAY, Disp: 90 tablet, Rfl: 0   No Known Allergies   Review of Systems  Constitutional: Negative.   Eyes: Negative for blurred vision.  Respiratory: Negative.   Cardiovascular: Negative.   Gastrointestinal: Negative.   Musculoskeletal: Negative for neck pain.  Neurological: Negative.   Psychiatric/Behavioral: Negative.      Today's Vitals   03/03/19 1151  BP: 132/70  Pulse: (!) 55  Temp: 98.1 F (36.7 C)  TempSrc: Oral  Weight: 168 lb 12.8 oz (76.6 kg)  Height: 5' 5"  (1.651 m)  PainSc: 0-No pain   Body mass index is 28.09 kg/m.   Objective:  Physical Exam Vitals signs and nursing note reviewed.  Constitutional:      Appearance: Normal appearance.  HENT:     Head: Normocephalic and atraumatic.  Cardiovascular:     Rate and Rhythm: Normal rate and regular rhythm.     Heart sounds: Normal heart sounds.     Comments: She has b/l LE edema. Pulmonary:     Effort: Pulmonary effort is normal.     Breath sounds: Normal breath sounds.  Skin:    General: Skin is warm.  Neurological:     General: No focal deficit present.     Mental Status: She is alert.  Psychiatric:        Mood and Affect: Mood normal.        Behavior: Behavior normal.         Assessment And Plan:     1. Hypertensive nephropathy  Controlled. She will continue with current meds. I will check renal function today. Importance of adequate hydration was discussed with the patient. She is also encouraged to limit her salt intake. She will rto in  six months for re-evaluation.   - BMP8+EGFR  2. Chronic renal disease, stage II  Chronic. I also reviewed her previous BMP panels. I DISCUSSED THE VARIOUS STAGES OF CHRONIC KIDNEY DISEASE AND THEIR CURRENT STAGE.  ALSO DISCUSSED IMPORTANCE OF BP CONTROL AND ADEQUATE HYDRATION.  3. Chronic pain of left knee  Chronic. Her symptoms are likely due to osteoarthritis. She may benefit from topical pain cream applied to affected area 2-3 times per day.   4. Bilateral leg edema  She is encouraged to elevated her legs when seated. Again, salt restriction is discussed with the patient. She is also reminded that LE edema is a side effect from amlodipine.   Allison Greenland, MD

## 2019-04-02 DIAGNOSIS — I129 Hypertensive chronic kidney disease with stage 1 through stage 4 chronic kidney disease, or unspecified chronic kidney disease: Secondary | ICD-10-CM | POA: Insufficient documentation

## 2019-04-02 DIAGNOSIS — M25562 Pain in left knee: Secondary | ICD-10-CM

## 2019-04-02 DIAGNOSIS — N182 Chronic kidney disease, stage 2 (mild): Secondary | ICD-10-CM | POA: Insufficient documentation

## 2019-04-02 DIAGNOSIS — G8929 Other chronic pain: Secondary | ICD-10-CM | POA: Insufficient documentation

## 2019-04-02 DIAGNOSIS — R600 Localized edema: Secondary | ICD-10-CM | POA: Insufficient documentation

## 2019-04-14 ENCOUNTER — Ambulatory Visit: Payer: Medicare Other | Admitting: Internal Medicine

## 2019-04-25 ENCOUNTER — Ambulatory Visit: Payer: Medicare Other | Admitting: Internal Medicine

## 2019-06-07 LAB — HM DIABETES EYE EXAM

## 2019-06-10 ENCOUNTER — Encounter: Payer: Self-pay | Admitting: Internal Medicine

## 2019-06-14 ENCOUNTER — Ambulatory Visit: Payer: Medicare Other | Admitting: Internal Medicine

## 2019-06-14 ENCOUNTER — Other Ambulatory Visit: Payer: Self-pay

## 2019-06-14 ENCOUNTER — Encounter: Payer: Self-pay | Admitting: Internal Medicine

## 2019-06-14 VITALS — BP 124/68 | HR 46 | Temp 97.9°F | Ht 65.0 in | Wt 160.0 lb

## 2019-06-14 DIAGNOSIS — M791 Myalgia, unspecified site: Secondary | ICD-10-CM

## 2019-06-14 DIAGNOSIS — Z6826 Body mass index (BMI) 26.0-26.9, adult: Secondary | ICD-10-CM

## 2019-06-14 DIAGNOSIS — N182 Chronic kidney disease, stage 2 (mild): Secondary | ICD-10-CM

## 2019-06-14 DIAGNOSIS — I129 Hypertensive chronic kidney disease with stage 1 through stage 4 chronic kidney disease, or unspecified chronic kidney disease: Secondary | ICD-10-CM | POA: Diagnosis not present

## 2019-06-14 DIAGNOSIS — M25512 Pain in left shoulder: Secondary | ICD-10-CM

## 2019-06-14 MED ORDER — MAGNESIUM 250 MG PO TABS: 250.0000 mg | ORAL_TABLET | Freq: Every day | ORAL | 0 refills | Status: AC

## 2019-06-14 MED ORDER — AMLODIPINE BESYLATE 2.5 MG PO TABS: 2.5000 mg | ORAL_TABLET | Freq: Every day | ORAL | 1 refills | Status: DC

## 2019-06-14 NOTE — Patient Instructions (Signed)
VOLTAREN GEL FOR KNEE PAIN   YOU MAY GET THIS OVER THE COUNTER   Acute Knee Pain, Adult Many things can cause knee pain. Sometimes, knee pain is sudden (acute) and may be caused by damage, swelling, or irritation of the muscles and tissues that support your knee. The pain often goes away on its own with time and rest. If the pain does not go away, tests may be done to find out what is causing the pain. Follow these instructions at home: Pay attention to any changes in your symptoms. Take these actions to relieve your pain. If you have a knee sleeve or brace:   Wear the sleeve or brace as told by your doctor. Remove it only as told by your doctor.  Loosen the sleeve or brace if your toes: ? Tingle. ? Become numb. ? Turn cold and blue.  Keep the sleeve or brace clean.  If the sleeve or brace is not waterproof: ? Do not let it get wet. ? Cover it with a watertight covering when you take a bath or shower. Activity  Rest your knee.  Do not do things that cause pain.  Avoid activities where both feet leave the ground at the same time (high-impact activities). Examples are running, jumping rope, and doing jumping jacks.  Work with a physical therapist to make a safe exercise program, as told by your doctor. Managing pain, stiffness, and swelling   If told, put ice on the knee: ? Put ice in a plastic bag. ? Place a towel between your skin and the bag. ? Leave the ice on for 20 minutes, 2-3 times a day.  If told, put pressure (compression) on your injured knee to control swelling, give support, and help with discomfort. Compression may be done with an elastic bandage. General instructions  Take all medicines only as told by your doctor.  Raise (elevate) your knee while you are sitting or lying down. Make sure your knee is higher than your heart.  Sleep with a pillow under your knee.  Do not use any products that contain nicotine or tobacco. These include cigarettes,  e-cigarettes, and chewing tobacco. These products may slow down healing. If you need help quitting, ask your doctor.  If you are overweight, work with your doctor and a food expert (dietitian) to set goals to lose weight. Being overweight can make your knee hurt more.  Keep all follow-up visits as told by your doctor. This is important. Contact a doctor if:  The knee pain does not stop.  The knee pain changes or gets worse.  You have a fever along with knee pain.  Your knee feels warm when you touch it.  Your knee gives out or locks up. Get help right away if:  Your knee swells, and the swelling gets worse.  You cannot move your knee.  You have very bad knee pain. Summary  Many things can cause knee pain. The pain often goes away on its own with time and rest.  Your doctor may do tests to find out the cause of the pain.  Pay attention to any changes in your symptoms. Relieve your pain with rest, medicines, light activity, and use of ice.  Get help right away if you cannot move your knee or your knee pain is very bad. This information is not intended to replace advice given to you by your health care provider. Make sure you discuss any questions you have with your health care provider. Document Released:  03/06/2009 Document Revised: 05/20/2018 Document Reviewed: 05/20/2018 Elsevier Interactive Patient Education  [employer]2019 Elsevier Inc.

## 2019-06-15 LAB — BMP8+EGFR
BUN/Creatinine Ratio: 19 (ref 12–28)
BUN: 14 mg/dL (ref 10–36)
CO2: 26 mmol/L (ref 20–29)
Calcium: 10.2 mg/dL (ref 8.7–10.3)
Chloride: 103 mmol/L (ref 96–106)
Creatinine, Ser: 0.75 mg/dL (ref 0.57–1.00)
GFR calc Af Amer: 76 mL/min/{1.73_m2} (ref >59)
GFR calc non Af Amer: 66 mL/min/{1.73_m2} (ref >59)
Glucose: 85 mg/dL (ref 65–99)
Potassium: 4.5 mmol/L (ref 3.5–5.2)
Sodium: 140 mmol/L (ref 134–144)

## 2019-06-15 LAB — ANTINUCLEAR ANTIBODIES, IFA: ANA Titer 1: NEGATIVE

## 2019-06-15 LAB — CK: Total CK: 77 U/L (ref 26–161)

## 2019-06-19 NOTE — Progress Notes (Signed)
Subjective:     Patient ID: Allison Mills , female    DOB: Aug 02, 1916 , 85 y.o.   MRN: [id number]   Chief Complaint  Patient presents with  . Hypertension    HPI  She is here today for bp check.  She is accompanied by her daughter today. She reports compliance with medications.   Hypertension This is a chronic problem. The current episode started more than 1 year ago. The problem has been gradually improving since onset. The problem is controlled. Associated symptoms include peripheral edema. Pertinent negatives include no blurred vision or neck pain. Risk factors for coronary artery disease include post-menopausal state. Past treatments include calcium channel blockers. Hypertensive end-organ damage includes kidney disease. Identifiable causes of hypertension include chronic renal disease.     Past Medical History:  Diagnosis Date  . Coronary artery disease   . Hypertension   . Murmur   . RBBB (right bundle branch block)      Family History  Problem Relation Age of Onset  . Uterine cancer Mother   . Healthy Father   . Heart Problems Other      Current Outpatient Medications:  .  brimonidine (ALPHAGAN P) 0.1 % SOLN, Place 1 drop into both eyes 3 (three) times daily., Disp: , Rfl:  .  brinzolamide (AZOPT) 1 % ophthalmic suspension, Place 1 drop into both eyes 3 (three) times daily., Disp: , Rfl:  .  Cholecalciferol (VITAMIN D3) 50 MCG (2000 UT) capsule, Take 2,000 Units by mouth daily., Disp: , Rfl:  .  latanoprost (XALATAN) 0.005 % ophthalmic solution, 1 drop at bedtime., Disp: , Rfl:  .  Multiple Vitamins-Minerals (SENIOR MULTIVITAMIN PLUS PO), Take by mouth., Disp: , Rfl:  .  valsartan-hydrochlorothiazide (DIOVAN-HCT) 160-12.5 MG tablet, TAKE 1 TABLET BY MOUTH EVERY DAY, Disp: 90 tablet, Rfl: 0 .  amLODipine (NORVASC) 2.5 MG tablet, Take 1 tablet (2.5 mg total) by mouth daily., Disp: 90 tablet, Rfl: 1 .  Magnesium 250 MG TABS, Take 1 tablet (250 mg total) by mouth  daily., Disp: 90 tablet, Rfl: 0   No Known Allergies   Review of Systems  Constitutional: Negative.   Eyes: Negative for blurred vision.  Respiratory: Negative.   Cardiovascular: Negative.   Gastrointestinal: Negative.   Musculoskeletal: Positive for arthralgias. Negative for neck pain.       She c/o shoulder pain. Has muscle pains when lifting covers off of her. Also has pain with lifting her arms to comb her hair. She denies recent fall/trauma.  Neurological: Negative.   Psychiatric/Behavioral: Negative.      Today's Vitals   06/14/19 1123  BP: 124/68  Pulse: (!) 46  Temp: 97.9 F (36.6 C)  TempSrc: Oral  Weight: 160 lb (72.6 kg)  Height: 5' 5"  (1.651 m)  PainSc: 0-No pain   Body mass index is 26.63 kg/m.   Objective:  Physical Exam Vitals signs and nursing note reviewed.  Constitutional:      Appearance: Normal appearance.  HENT:     Head: Normocephalic and atraumatic.  Cardiovascular:     Rate and Rhythm: Normal rate and regular rhythm.     Heart sounds: Normal heart sounds.     Comments: LE edema has improved Pulmonary:     Effort: Pulmonary effort is normal.     Breath sounds: Normal breath sounds.  Musculoskeletal:     Left shoulder: She exhibits tenderness.  Skin:    General: Skin is warm.  Neurological:     General:  No focal deficit present.     Mental Status: She is alert.  Psychiatric:        Mood and Affect: Mood normal.        Behavior: Behavior normal.         Assessment And Plan:     1. Hypertensive nephropathy  Well controlled. She will continue with current meds. She is encouraged to avoid adding salt to her foods.   2. Chronic renal disease, stage II  Chronic. She is encouraged to stay well hydrated.   3. Body mass index (BMI) of 26.0 to 26.9 in adult  Her weight is stable.   4. Acute pain of left shoulder  She does not wish to see specialist at this time. She is encouraged to apply topical muscle rub to affected area twice  daily as needed. She will let me know if her sx persist.   5. Myalgia  She will likely benefit from magnesium supplementation. She is also encouraged to stay well hydrated. I will also check labs as listed below. I will make further recommendations once her labs are available for review.   - CK, total - BMP8+EGFR - ANA, IFA (with reflex)        [name], MD    THE PATIENT IS ENCOURAGED TO PRACTICE SOCIAL DISTANCING DUE TO THE COVID-19 PANDEMIC.

## 2019-09-27 ENCOUNTER — Ambulatory Visit (INDEPENDENT_AMBULATORY_CARE_PROVIDER_SITE_OTHER): Payer: Medicare Other

## 2019-09-27 ENCOUNTER — Other Ambulatory Visit: Payer: Self-pay

## 2019-09-27 VITALS — BP 120/72 | HR 67 | Temp 98.2°F

## 2019-09-27 DIAGNOSIS — Z23 Encounter for immunization: Secondary | ICD-10-CM | POA: Diagnosis not present

## 2019-09-27 NOTE — Progress Notes (Signed)
Pt here for flu shot

## 2019-11-10 ENCOUNTER — Ambulatory Visit: Payer: Medicare Other

## 2019-11-10 ENCOUNTER — Ambulatory Visit: Payer: Medicare Other | Admitting: Internal Medicine

## 2019-11-24 ENCOUNTER — Ambulatory Visit (INDEPENDENT_AMBULATORY_CARE_PROVIDER_SITE_OTHER): Payer: Medicare Other

## 2019-11-24 ENCOUNTER — Encounter: Payer: Self-pay | Admitting: Internal Medicine

## 2019-11-24 ENCOUNTER — Other Ambulatory Visit: Payer: Self-pay

## 2019-11-24 ENCOUNTER — Ambulatory Visit: Payer: Medicare Other | Admitting: Internal Medicine

## 2019-11-24 VITALS — BP 118/70 | HR 64 | Temp 97.8°F | Ht 65.0 in | Wt 166.0 lb

## 2019-11-24 VITALS — BP 118/70 | HR 64 | Temp 97.8°F | Ht 65.0 in | Wt 166.2 lb

## 2019-11-24 DIAGNOSIS — Z0000 Encounter for general adult medical examination without abnormal findings: Secondary | ICD-10-CM | POA: Diagnosis not present

## 2019-11-24 DIAGNOSIS — I129 Hypertensive chronic kidney disease with stage 1 through stage 4 chronic kidney disease, or unspecified chronic kidney disease: Secondary | ICD-10-CM | POA: Diagnosis not present

## 2019-11-24 DIAGNOSIS — M79641 Pain in right hand: Secondary | ICD-10-CM

## 2019-11-24 DIAGNOSIS — Z6827 Body mass index (BMI) 27.0-27.9, adult: Secondary | ICD-10-CM

## 2019-11-24 DIAGNOSIS — E663 Overweight: Secondary | ICD-10-CM

## 2019-11-24 DIAGNOSIS — N182 Chronic kidney disease, stage 2 (mild): Secondary | ICD-10-CM | POA: Diagnosis not present

## 2019-11-24 MED ORDER — LUMIGAN 0.01 % OP SOLN: 1.0000 [drp] | Freq: Every day | OPHTHALMIC | 1 refills | Status: AC

## 2019-11-24 MED ORDER — VALSARTAN-HYDROCHLOROTHIAZIDE 160-12.5 MG PO TABS: 1.0000 | ORAL_TABLET | Freq: Every day | ORAL | 2 refills | Status: DC

## 2019-11-24 MED ORDER — AMLODIPINE BESYLATE 2.5 MG PO TABS: 2.5000 mg | ORAL_TABLET | Freq: Every day | ORAL | 1 refills | Status: DC

## 2019-11-24 NOTE — Patient Instructions (Signed)
Allison Mills , Thank you for taking time to come for your Medicare Wellness Visit. I appreciate your ongoing commitment to your health goals. Please review the following plan we discussed and let me know if I can assist you in the future.   Screening recommendations/referrals: Colonoscopy: not required Mammogram: not required Bone Density: 10/2010 Recommended yearly ophthalmology/optometry visit for glaucoma screening and checkup Recommended yearly dental visit for hygiene and checkup  Vaccinations: Influenza vaccine: 09/2019 Pneumococcal vaccine: daughter checking  Tdap vaccine: declined Shingles vaccine: discussed    Advanced directives: Advance directive discussed with you today. Even though you declined this today please call our office should you change your mind and we can give you the proper paperwork for you to fill out.   Conditions/risks identified: overweight  Next appointment: 05/24/2020 at 11:30   Preventive Care 83 Years and Older, Female Preventive care refers to lifestyle choices and visits with your health care provider that can promote health and wellness. What does preventive care include?  A yearly physical exam. This is also called an annual well check.  Dental exams once or twice a year.  Routine eye exams. Ask your health care provider how often you should have your eyes checked.  Personal lifestyle choices, including:  Daily care of your teeth and gums.  Regular physical activity.  Eating a healthy diet.  Avoiding tobacco and drug use.  Limiting alcohol use.  Practicing safe sex.  Taking low-dose aspirin every day.  Taking vitamin and mineral supplements as recommended by your health care provider. What happens during an annual well check? The services and screenings done by your health care provider during your annual well check will depend on your age, overall health, lifestyle risk factors, and family history of disease. Counseling  Your  health care provider may ask you questions about your:  Alcohol use.  Tobacco use.  Drug use.  Emotional well-being.  Home and relationship well-being.  Sexual activity.  Eating habits.  History of falls.  Memory and ability to understand (cognition).  Work and work Statistician.  Reproductive health. Screening  You may have the following tests or measurements:  Height, weight, and BMI.  Blood pressure.  Lipid and cholesterol levels. These may be checked every 5 years, or more frequently if you are over 53 years old.  Skin check.  Lung cancer screening. You may have this screening every year starting at age 56 if you have a 30-pack-year history of smoking and currently smoke or have quit within the past 15 years.  Fecal occult blood test (FOBT) of the stool. You may have this test every year starting at age 53.  Flexible sigmoidoscopy or colonoscopy. You may have a sigmoidoscopy every 5 years or a colonoscopy every 10 years starting at age 59.  Hepatitis C blood test.  Hepatitis B blood test.  Sexually transmitted disease (STD) testing.  Diabetes screening. This is done by checking your blood sugar (glucose) after you have not eaten for a while (fasting). You may have this done every 1-3 years.  Bone density scan. This is done to screen for osteoporosis. You may have this done starting at age 68.  Mammogram. This may be done every 1-2 years. Talk to your health care provider about how often you should have regular mammograms. Talk with your health care provider about your test results, treatment options, and if necessary, the need for more tests. Vaccines  Your health care provider may recommend certain vaccines, such as:  Influenza vaccine.  This is recommended every year.  Tetanus, diphtheria, and acellular pertussis (Tdap, Td) vaccine. You may need a Td booster every 10 years.  Zoster vaccine. You may need this after age 58.  Pneumococcal 13-valent  conjugate (PCV13) vaccine. One dose is recommended after age 4.  Pneumococcal polysaccharide (PPSV23) vaccine. One dose is recommended after age 62. Talk to your health care provider about which screenings and vaccines you need and how often you need them. This information is not intended to replace advice given to you by your health care provider. Make sure you discuss any questions you have with your health care provider. Document Released: 01/04/2016 Document Revised: 08/27/2016 Document Reviewed: 10/09/2015 Elsevier Interactive Patient Education  2017 [place] Prevention in the Home Falls can cause injuries. They can happen to people of all ages. There are many things you can do to make your home safe and to help prevent falls. What can I do on the outside of my home?  Regularly fix the edges of walkways and driveways and fix any cracks.  Remove anything that might make you trip as you walk through a door, such as a raised step or threshold.  Trim any bushes or trees on the path to your home.  Use bright outdoor lighting.  Clear any walking paths of anything that might make someone trip, such as rocks or tools.  Regularly check to see if handrails are loose or broken. Make sure that both sides of any steps have handrails.  Any raised decks and porches should have guardrails on the edges.  Have any leaves, snow, or ice cleared regularly.  Use sand or salt on walking paths during winter.  Clean up any spills in your garage right away. This includes oil or grease spills. What can I do in the bathroom?  Use night lights.  Install grab bars by the toilet and in the tub and shower. Do not use towel bars as grab bars.  Use non-skid mats or decals in the tub or shower.  If you need to sit down in the shower, use a plastic, non-slip stool.  Keep the floor dry. Clean up any water that spills on the floor as soon as it happens.  Remove soap buildup in the tub or  shower regularly.  Attach bath mats securely with double-sided non-slip rug tape.  Do not have throw rugs and other things on the floor that can make you trip. What can I do in the bedroom?  Use night lights.  Make sure that you have a light by your bed that is easy to reach.  Do not use any sheets or blankets that are too big for your bed. They should not hang down onto the floor.  Have a firm chair that has side arms. You can use this for support while you get dressed.  Do not have throw rugs and other things on the floor that can make you trip. What can I do in the kitchen?  Clean up any spills right away.  Avoid walking on wet floors.  Keep items that you use a lot in easy-to-reach places.  If you need to reach something above you, use a strong step stool that has a grab bar.  Keep electrical cords out of the way.  Do not use floor polish or wax that makes floors slippery. If you must use wax, use non-skid floor wax.  Do not have throw rugs and other things on the floor that can make  you trip. What can I do with my stairs?  Do not leave any items on the stairs.  Make sure that there are handrails on both sides of the stairs and use them. Fix handrails that are broken or loose. Make sure that handrails are as long as the stairways.  Check any carpeting to make sure that it is firmly attached to the stairs. Fix any carpet that is loose or worn.  Avoid having throw rugs at the top or bottom of the stairs. If you do have throw rugs, attach them to the floor with carpet tape.  Make sure that you have a light switch at the top of the stairs and the bottom of the stairs. If you do not have them, ask someone to add them for you. What else can I do to help prevent falls?  Wear shoes that:  Do not have high heels.  Have rubber bottoms.  Are comfortable and fit you well.  Are closed at the toe. Do not wear sandals.  If you use a stepladder:  Make sure that it is fully  opened. Do not climb a closed stepladder.  Make sure that both sides of the stepladder are locked into place.  Ask someone to hold it for you, if possible.  Clearly mark and make sure that you can see:  Any grab bars or handrails.  First and last steps.  Where the edge of each step is.  Use tools that help you move around (mobility aids) if they are needed. These include:  Canes.  Walkers.  Scooters.  Crutches.  Turn on the lights when you go into a dark area. Replace any light bulbs as soon as they burn out.  Set up your furniture so you have a clear path. Avoid moving your furniture around.  If any of your floors are uneven, fix them.  If there are any pets around you, be aware of where they are.  Review your medicines with your doctor. Some medicines can make you feel dizzy. This can increase your chance of falling. Ask your doctor what other things that you can do to help prevent falls. This information is not intended to replace advice given to you by your health care provider. Make sure you discuss any questions you have with your health care provider. Document Released: 10/04/2009 Document Revised: 05/15/2016 Document Reviewed: 01/12/2015 Elsevier Interactive Patient Education  2017 [employer].

## 2019-11-24 NOTE — Progress Notes (Signed)
This visit occurred during the SARS-CoV-2 public health emergency.  Safety protocols were in place, including screening questions prior to the visit, additional usage of staff PPE, and extensive cleaning of exam room while observing appropriate contact time as indicated for disinfecting solutions.  Subjective:   Allison Mills is a 85 y.o. female who presents for Medicare Annual (Subsequent) preventive examination.  Review of Systems:  n/a Cardiac Risk Factors include: advanced age (>29men, >55 women);hypertension     Objective:     Vitals: BP 118/70 (BP Location: Left Arm, Patient Position: Sitting)   Pulse 64   Temp 97.8 F (36.6 C) (Oral)   Ht 5\' 5"  (1.651 m)   Wt 166 lb (75.3 kg)   BMI 27.62 kg/m   Body mass index is 27.62 kg/m.  Advanced Directives 11/24/2019 11/04/2018  Does Patient Have a Medical Advance Directive? No No  Would patient like information on creating a medical advance directive? No - Patient declined Yes (MAU/Ambulatory/Procedural Areas - Information given)    Tobacco Social History   Tobacco Use  Smoking Status Never Smoker  Smokeless Tobacco Never Used     Counseling given: Not Answered   Clinical Intake:  Pre-visit preparation completed: Yes  Pain : No/denies pain     Nutritional Status: BMI 25 -29 Overweight Nutritional Risks: None Diabetes: No  How often do you need to have someone help you when you read instructions, pamphlets, or other written materials from your doctor or pharmacy?: 1 - Never What is the last grade level you completed in school?: 12th grade  Interpreter Needed?: No  Information entered by :: NAllen LPN  Past Medical History:  Diagnosis Date  . Coronary artery disease   . Hypertension   . Murmur   . RBBB (right bundle branch block)    History reviewed. No pertinent surgical history. Family History  Problem Relation Age of Onset  . Uterine cancer Mother   . Healthy Father   . Heart Problems Other     Social History   Socioeconomic History  . Marital status: Widowed    Spouse name: Not on file  . Number of children: Not on file  . Years of education: Not on file  . Highest education level: Not on file  Occupational History  . Occupation: retired  Scientific laboratory technician  . Financial resource strain: Not hard at all  . Food insecurity    Worry: Never true    Inability: Never true  . Transportation needs    Medical: No    Non-medical: No  Tobacco Use  . Smoking status: Never Smoker  . Smokeless tobacco: Never Used  Substance and Sexual Activity  . Alcohol use: No  . Drug use: No  . Sexual activity: Not Currently  Lifestyle  . Physical activity    Days per week: 2 days    Minutes per session: 30 min  . Stress: Not at all  Relationships  . Social Herbalist on phone: Not on file    Gets together: Not on file    Attends religious service: Not on file    Active member of club or organization: Not on file    Attends meetings of clubs or organizations: Not on file    Relationship status: Not on file  Other Topics Concern  . Not on file  Social History Narrative  . Not on file    Outpatient Encounter Medications as of 11/24/2019  Medication Sig  . brimonidine (ALPHAGAN P)  0.1 % SOLN Place 1 drop into both eyes 3 (three) times daily.  . brinzolamide (AZOPT) 1 % ophthalmic suspension Place 1 drop into both eyes 3 (three) times daily.  . Cholecalciferol (VITAMIN D3) 50 MCG (2000 UT) capsule Take 2,000 Units by mouth daily.  . Magnesium 250 MG TABS Take 1 tablet (250 mg total) by mouth daily.  . Multiple Vitamins-Minerals (SENIOR MULTIVITAMIN PLUS PO) Take by mouth.  . [DISCONTINUED] amLODipine (NORVASC) 2.5 MG tablet Take 1 tablet (2.5 mg total) by mouth daily.  . [DISCONTINUED] latanoprost (XALATAN) 0.005 % ophthalmic solution 1 drop at bedtime.  . [DISCONTINUED] valsartan-hydrochlorothiazide (DIOVAN-HCT) 160-12.5 MG tablet TAKE 1 TABLET BY MOUTH EVERY DAY   No  facility-administered encounter medications on file as of 11/24/2019.     Activities of Daily Living In your present state of health, do you have any difficulty performing the following activities: 11/24/2019 11/24/2019  Hearing? Y N  Comment wears hearing aides has hearing aid  Vision? N N  Difficulty concentrating or making decisions? N N  Walking or climbing stairs? N Y  Comment - uses a walker  Dressing or bathing? N N  Doing errands, shopping? N Y  Comment - daughter drives her to run Multimedia programmer and eating ? N -  Using the Toilet? N -  In the past six months, have you accidently leaked urine? Y -  Comment only if holds too long -  Do you have problems with loss of bowel control? N -  Managing your Medications? N -  Managing your Finances? N -  Housekeeping or managing your Housekeeping? N -  Some recent data might be hidden    Patient Care Team: Dorothyann Peng, MD as PCP - General (Internal Medicine)    Assessment:   This is a routine wellness examination for [place].  Exercise Activities and Dietary recommendations Current Exercise Habits: Home exercise routine, Type of exercise: stretching;strength training/weights, Time (Minutes): 30, Frequency (Times/Week): 2, Weekly Exercise (Minutes/Week): 60  Goals    . DIET - REDUCE SALT INTAKE TO 2 GRAMS PER DAY OR LESS (pt-stated)     Does not want to eat salty foods    . Patient Stated     11/24/2019 no goals set at this time       Fall Risk Fall Risk  11/24/2019 11/24/2019 06/14/2019 03/03/2019 11/04/2018  Falls in the past year? 0 0 0 0 0  Risk for fall due to : Impaired mobility;Medication side effect - - - Medication side effect  Follow up Falls evaluation completed;Education provided;Falls prevention discussed - - - -   Is the patient's home free of loose throw rugs in walkways, pet beds, electrical cords, etc?   yes      Grab bars in the bathroom? yes      Handrails on the stairs?   n/a      Adequate  lighting?   yes  Timed Get Up and Go performed: n/a  Depression Screen PHQ 2/9 Scores 11/24/2019 06/14/2019 03/03/2019 11/04/2018  PHQ - 2 Score 0 0 0 0     Cognitive Function     6CIT Screen 11/24/2019 11/04/2018  What Year? 0 points 0 points  What month? 3 points 0 points  What time? 0 points 3 points  Count back from 20 0 points 0 points  Months in reverse 0 points 0 points  Repeat phrase 10 points 2 points  Total Score 13 5    Immunization History  Administered  Date(s) Administered  . Influenza, High Dose Seasonal PF 11/04/2018, 09/27/2019    Qualifies for Shingles Vaccine?   Screening Tests Health Maintenance  Topic Date Due  . PNA vac Low Risk Adult (1 of 2 - PCV13) 2019-09-3083  . TETANUS/TDAP  11/23/2020 (Originally 2019-09-3037)  . INFLUENZA VACCINE  Completed  . DEXA SCAN  Completed    Cancer Screenings: Lung: Low Dose CT Chest recommended if Age 2-80 years, 30 pack-year currently smoking OR have quit w/in 15years. Patient does not qualify. Breast:  Up to date on Mammogram? Yes   Up to date of Bone Density/Dexa? Yes Colorectal: not required  Additional Screenings: : Hepatitis C Screening: n/a     Plan:    No goals set at this time   I have personally reviewed and noted the following in the patient's chart:   . Medical and social history . Use of alcohol, tobacco or illicit drugs  . Current medications and supplements . Functional ability and status . Nutritional status . Physical activity . Advanced directives . List of other physicians . Hospitalizations, surgeries, and ER visits in previous 12 months . Vitals . Screenings to include cognitive, depression, and falls . Referrals and appointments  In addition, I have reviewed and discussed with patient certain preventive protocols, quality metrics, and best practice recommendations. A written personalized care plan for preventive services as well as general preventive health recommendations were  provided to patient.     Barb Merinoickeah E , LPN  82/9/562112/02/2019

## 2019-11-25 LAB — CMP14+EGFR
ALT: 10 IU/L (ref 0–32)
AST: 14 IU/L (ref 0–40)
Albumin/Globulin Ratio: 2 (ref 1.2–2.2)
Albumin: 4.3 g/dL (ref 3.5–4.6)
Alkaline Phosphatase: 70 IU/L (ref 39–117)
BUN/Creatinine Ratio: 16 (ref 12–28)
BUN: 13 mg/dL (ref 10–36)
Bilirubin Total: 0.4 mg/dL (ref 0.0–1.2)
CO2: 26 mmol/L (ref 20–29)
Calcium: 10.1 mg/dL (ref 8.7–10.3)
Chloride: 102 mmol/L (ref 96–106)
Creatinine, Ser: 0.82 mg/dL (ref 0.57–1.00)
GFR calc Af Amer: 67 mL/min/{1.73_m2} (ref >59)
GFR calc non Af Amer: 59 mL/min/{1.73_m2} — ABNORMAL LOW (ref >59)
Globulin, Total: 2.1 g/dL (ref 1.5–4.5)
Glucose: 89 mg/dL (ref 65–99)
Potassium: 4.8 mmol/L (ref 3.5–5.2)
Sodium: 140 mmol/L (ref 134–144)
Total Protein: 6.4 g/dL (ref 6.0–8.5)

## 2019-11-28 NOTE — Progress Notes (Signed)
This visit occurred during the SARS-CoV-2 public health emergency.  Safety protocols were in place, including screening questions prior to the visit, additional usage of staff PPE, and extensive cleaning of exam room while observing appropriate contact time as indicated for disinfecting solutions.  Subjective:     Patient ID: Allison Mills , female    DOB: 10/31/1916 , 85 y.o.   MRN: [id number]   Chief Complaint  Patient presents with  . Hypertension    HPI  She is here today for bp check.  She is accompanied by her daughter today. She reports compliance with medications.   Hypertension This is a chronic problem. The current episode started more than 1 year ago. The problem has been gradually improving since onset. The problem is controlled. Associated symptoms include peripheral edema. Pertinent negatives include no blurred vision or neck pain. Risk factors for coronary artery disease include post-menopausal state. Past treatments include calcium channel blockers. Hypertensive end-organ damage includes kidney disease. Identifiable causes of hypertension include chronic renal disease.     Past Medical History:  Diagnosis Date  . Coronary artery disease   . Hypertension   . Murmur   . RBBB (right bundle branch block)      Family History  Problem Relation Age of Onset  . Uterine cancer Mother   . Healthy Father   . Heart Problems Other      Current Outpatient Medications:  .  amLODipine (NORVASC) 2.5 MG tablet, Take 1 tablet (2.5 mg total) by mouth daily., Disp: 90 tablet, Rfl: 1 .  brimonidine (ALPHAGAN P) 0.1 % SOLN, Place 1 drop into both eyes 3 (three) times daily., Disp: , Rfl:  .  brinzolamide (AZOPT) 1 % ophthalmic suspension, Place 1 drop into both eyes 3 (three) times daily., Disp: , Rfl:  .  Cholecalciferol (VITAMIN D3) 50 MCG (2000 UT) capsule, Take 2,000 Units by mouth daily., Disp: , Rfl:  .  Magnesium 250 MG TABS, Take 1 tablet (250 mg total) by mouth daily.,  Disp: 90 tablet, Rfl: 0 .  Multiple Vitamins-Minerals (SENIOR MULTIVITAMIN PLUS PO), Take by mouth., Disp: , Rfl:  .  valsartan-hydrochlorothiazide (DIOVAN-HCT) 160-12.5 MG tablet, Take 1 tablet by mouth daily., Disp: 90 tablet, Rfl: 2 .  bimatoprost (LUMIGAN) 0.01 % SOLN, Place 1 drop into both eyes at bedtime., Disp: 7.5 mL, Rfl: 1   No Known Allergies   Review of Systems  Constitutional: Negative.   Eyes: Negative for blurred vision.  Respiratory: Negative.   Cardiovascular: Negative.   Gastrointestinal: Negative.   Musculoskeletal: Positive for arthralgias. Negative for neck pain.       She c/o right hand pain. Denies fall/trauma. Her hand is stiff upon awakening.   Neurological: Negative.   Psychiatric/Behavioral: Negative.      Today's Vitals   11/24/19 1139  BP: 118/70  Pulse: 64  Temp: 97.8 F (36.6 C)  TempSrc: Oral  SpO2: 94%  Weight: 166 lb 3.2 oz (75.4 kg)  Height: 5' 5"  (1.651 m)   Body mass index is 27.66 kg/m.   Objective:  Physical Exam Vitals signs and nursing note reviewed.  Constitutional:      Appearance: Normal appearance.  HENT:     Head: Normocephalic and atraumatic.  Cardiovascular:     Rate and Rhythm: Normal rate and regular rhythm.     Heart sounds: Normal heart sounds.  Pulmonary:     Effort: Pulmonary effort is normal.     Breath sounds: Normal breath sounds.  Musculoskeletal:  Right lower leg: 1+ Pitting Edema present.     Left lower leg: 1+ Pitting Edema present.  Skin:    General: Skin is warm.  Neurological:     General: No focal deficit present.     Mental Status: She is alert.  Psychiatric:        Mood and Affect: Mood normal.        Behavior: Behavior normal.         Assessment And Plan:     1. Hypertensive nephropathy  Chronic, well controlled. She will continue with current meds. She is encouraged to avoid adding salt to her foods. I will check renal function today.   - CMP14+EGFR  2. Chronic renal disease,  stage II  Chronic, yet stable. I will check renal function today.   3. Right hand pain  Her sx are likely due to OA. She is advised to apply to affected area daily as needed.   4. Overweight with body mass index (BMI) of 27 to 27.9 in adult  Her weight is stable for her age group.   Allison Greenland, MD    THE PATIENT IS ENCOURAGED TO PRACTICE SOCIAL DISTANCING DUE TO THE COVID-19 PANDEMIC.

## 2020-01-20 ENCOUNTER — Telehealth: Payer: Self-pay

## 2020-01-20 NOTE — Telephone Encounter (Signed)
-----   Message from Dorothyann Peng, MD sent at 01/19/2020  5:19 PM EST ----- Does the facility where pt lives offer vaccines? Is she planning to get COVID vaccine?

## 2020-02-15 ENCOUNTER — Telehealth: Payer: Self-pay

## 2020-02-15 NOTE — Telephone Encounter (Signed)
The patients daughter Allison Mills said that the pt's toenails needed to be clipped and wanted to know who does dr sanders recommends for a podiatry.  Allison Mills was told that the pt would need a referral to podiatry.  Allison Mills said that she would call back later for an appt because the pt's son recently died and they are dealing with his death.

## 2020-02-29 ENCOUNTER — Telehealth: Payer: Self-pay

## 2020-02-29 NOTE — Telephone Encounter (Signed)
The pt's daughter [place] called and said that when she picked her mother's refill of amlodipine that it was 5 mg and not the 2.5 mg that Dr. Allyne Gee changed her to.  Ms. Munson Lions was told that the office hasn't sent the amlodipine medication for her mother since November 24, 2019 and that it was for 90 days with 1 refill and for her to contact the pharmacy about the error and that she can cut the tablets in half and give her mother a half dose.

## 2020-03-28 DIAGNOSIS — Z20828 Contact with and (suspected) exposure to other viral communicable diseases: Secondary | ICD-10-CM | POA: Diagnosis not present

## 2020-03-28 DIAGNOSIS — Z1159 Encounter for screening for other viral diseases: Secondary | ICD-10-CM | POA: Diagnosis not present

## 2020-04-04 DIAGNOSIS — Z20828 Contact with and (suspected) exposure to other viral communicable diseases: Secondary | ICD-10-CM | POA: Diagnosis not present

## 2020-04-04 DIAGNOSIS — Z1159 Encounter for screening for other viral diseases: Secondary | ICD-10-CM | POA: Diagnosis not present

## 2020-04-11 DIAGNOSIS — H43813 Vitreous degeneration, bilateral: Secondary | ICD-10-CM | POA: Diagnosis not present

## 2020-04-11 DIAGNOSIS — H401133 Primary open-angle glaucoma, bilateral, severe stage: Secondary | ICD-10-CM | POA: Diagnosis not present

## 2020-04-11 DIAGNOSIS — Z961 Presence of intraocular lens: Secondary | ICD-10-CM | POA: Diagnosis not present

## 2020-04-11 DIAGNOSIS — H2512 Age-related nuclear cataract, left eye: Secondary | ICD-10-CM | POA: Diagnosis not present

## 2020-04-11 DIAGNOSIS — H16223 Keratoconjunctivitis sicca, not specified as Sjogren's, bilateral: Secondary | ICD-10-CM | POA: Diagnosis not present

## 2020-04-11 DIAGNOSIS — H524 Presbyopia: Secondary | ICD-10-CM | POA: Diagnosis not present

## 2020-04-11 DIAGNOSIS — Z1159 Encounter for screening for other viral diseases: Secondary | ICD-10-CM | POA: Diagnosis not present

## 2020-04-11 DIAGNOSIS — Z20828 Contact with and (suspected) exposure to other viral communicable diseases: Secondary | ICD-10-CM | POA: Diagnosis not present

## 2020-04-11 DIAGNOSIS — H02105 Unspecified ectropion of left lower eyelid: Secondary | ICD-10-CM | POA: Diagnosis not present

## 2020-04-11 DIAGNOSIS — H47323 Drusen of optic disc, bilateral: Secondary | ICD-10-CM | POA: Diagnosis not present

## 2020-04-18 DIAGNOSIS — Z1159 Encounter for screening for other viral diseases: Secondary | ICD-10-CM | POA: Diagnosis not present

## 2020-04-18 DIAGNOSIS — Z20828 Contact with and (suspected) exposure to other viral communicable diseases: Secondary | ICD-10-CM | POA: Diagnosis not present

## 2020-04-25 DIAGNOSIS — Z20828 Contact with and (suspected) exposure to other viral communicable diseases: Secondary | ICD-10-CM | POA: Diagnosis not present

## 2020-04-25 DIAGNOSIS — Z1159 Encounter for screening for other viral diseases: Secondary | ICD-10-CM | POA: Diagnosis not present

## 2020-05-02 DIAGNOSIS — Z20828 Contact with and (suspected) exposure to other viral communicable diseases: Secondary | ICD-10-CM | POA: Diagnosis not present

## 2020-05-02 DIAGNOSIS — Z1159 Encounter for screening for other viral diseases: Secondary | ICD-10-CM | POA: Diagnosis not present

## 2020-05-09 DIAGNOSIS — Z1159 Encounter for screening for other viral diseases: Secondary | ICD-10-CM | POA: Diagnosis not present

## 2020-05-09 DIAGNOSIS — Z20828 Contact with and (suspected) exposure to other viral communicable diseases: Secondary | ICD-10-CM | POA: Diagnosis not present

## 2020-05-16 DIAGNOSIS — Z1159 Encounter for screening for other viral diseases: Secondary | ICD-10-CM | POA: Diagnosis not present

## 2020-05-16 DIAGNOSIS — Z20828 Contact with and (suspected) exposure to other viral communicable diseases: Secondary | ICD-10-CM | POA: Diagnosis not present

## 2020-05-23 DIAGNOSIS — Z1159 Encounter for screening for other viral diseases: Secondary | ICD-10-CM | POA: Diagnosis not present

## 2020-05-23 DIAGNOSIS — Z20828 Contact with and (suspected) exposure to other viral communicable diseases: Secondary | ICD-10-CM | POA: Diagnosis not present

## 2020-05-24 ENCOUNTER — Telehealth: Payer: Self-pay

## 2020-05-24 ENCOUNTER — Ambulatory Visit: Payer: Medicare Other | Admitting: Internal Medicine

## 2020-05-24 NOTE — Telephone Encounter (Signed)
Called to reschedule appointments due to provider not being in the office. Daughter Lissa Hoard said that she didn't have this appointment in the books so she wants to cancel and call back to reschedule

## 2020-05-30 DIAGNOSIS — Z1159 Encounter for screening for other viral diseases: Secondary | ICD-10-CM | POA: Diagnosis not present

## 2020-05-30 DIAGNOSIS — Z20828 Contact with and (suspected) exposure to other viral communicable diseases: Secondary | ICD-10-CM | POA: Diagnosis not present

## 2020-05-31 ENCOUNTER — Encounter: Payer: Self-pay | Admitting: Nurse Practitioner

## 2020-05-31 ENCOUNTER — Ambulatory Visit (INDEPENDENT_AMBULATORY_CARE_PROVIDER_SITE_OTHER): Payer: Medicare PPO | Admitting: Nurse Practitioner

## 2020-05-31 ENCOUNTER — Ambulatory Visit (INDEPENDENT_AMBULATORY_CARE_PROVIDER_SITE_OTHER): Payer: Medicare PPO

## 2020-05-31 ENCOUNTER — Other Ambulatory Visit: Payer: Self-pay

## 2020-05-31 VITALS — BP 138/72 | HR 57 | Temp 98.1°F | Ht 65.0 in | Wt 161.0 lb

## 2020-05-31 VITALS — BP 138/72 | HR 57 | Temp 98.1°F | Wt 161.0 lb

## 2020-05-31 DIAGNOSIS — N182 Chronic kidney disease, stage 2 (mild): Secondary | ICD-10-CM

## 2020-05-31 DIAGNOSIS — Z23 Encounter for immunization: Secondary | ICD-10-CM

## 2020-05-31 DIAGNOSIS — Z6826 Body mass index (BMI) 26.0-26.9, adult: Secondary | ICD-10-CM

## 2020-05-31 DIAGNOSIS — Z0000 Encounter for general adult medical examination without abnormal findings: Secondary | ICD-10-CM | POA: Diagnosis not present

## 2020-05-31 DIAGNOSIS — E663 Overweight: Secondary | ICD-10-CM

## 2020-05-31 DIAGNOSIS — M25562 Pain in left knee: Secondary | ICD-10-CM | POA: Diagnosis not present

## 2020-05-31 DIAGNOSIS — W19XXXA Unspecified fall, initial encounter: Secondary | ICD-10-CM | POA: Diagnosis not present

## 2020-05-31 DIAGNOSIS — Z79899 Other long term (current) drug therapy: Secondary | ICD-10-CM | POA: Diagnosis not present

## 2020-05-31 DIAGNOSIS — I129 Hypertensive chronic kidney disease with stage 1 through stage 4 chronic kidney disease, or unspecified chronic kidney disease: Secondary | ICD-10-CM | POA: Diagnosis not present

## 2020-05-31 MED ORDER — PREVNAR 13 IM SUSP: 0.5000 mL | INTRAMUSCULAR | 0 refills | Status: AC

## 2020-05-31 MED ORDER — AMLODIPINE BESYLATE 2.5 MG PO TABS: 2.5000 mg | ORAL_TABLET | Freq: Every day | ORAL | 1 refills | Status: DC

## 2020-05-31 MED ORDER — VALSARTAN-HYDROCHLOROTHIAZIDE 160-12.5 MG PO TABS: 1.0000 | ORAL_TABLET | Freq: Every day | ORAL | 2 refills | Status: DC

## 2020-05-31 NOTE — Patient Instructions (Signed)
Allison Mills , Thank you for taking time to come for your Medicare Wellness Visit. I appreciate your ongoing commitment to your health goals. Please review the following plan we discussed and let me know if I can assist you in the future.   Screening recommendations/referrals: Colonoscopy: not required Mammogram: not required Bone Density: 10/2010 Recommended yearly ophthalmology/optometry visit for glaucoma screening and checkup Recommended yearly dental visit for hygiene and checkup  Vaccinations: Influenza vaccine: 09/2019 Pneumococcal vaccine: sent to pharmacy Tdap vaccine: declined Shingles vaccine: discussed    Advanced directives: Advance directive discussed with you today. Even though you declined this today please call our office should you change your mind and we can give you the proper paperwork for you to fill out.  Conditions/risks identified: overweight  Next appointment: 11/28/2020 at 11:00   Preventive Care 65 Years and Older, Female Preventive care refers to lifestyle choices and visits with your health care provider that can promote health and wellness. What does preventive care include?  A yearly physical exam. This is also called an annual well check.  Dental exams once or twice a year.  Routine eye exams. Ask your health care provider how often you should have your eyes checked.  Personal lifestyle choices, including:  Daily care of your teeth and gums.  Regular physical activity.  Eating a healthy diet.  Avoiding tobacco and drug use.  Limiting alcohol use.  Practicing safe sex.  Taking low-dose aspirin every day.  Taking vitamin and mineral supplements as recommended by your health care provider. What happens during an annual well check? The services and screenings done by your health care provider during your annual well check will depend on your age, overall health, lifestyle risk factors, and family history of disease. Counseling  Your  health care provider may ask you questions about your:  Alcohol use.  Tobacco use.  Drug use.  Emotional well-being.  Home and relationship well-being.  Sexual activity.  Eating habits.  History of falls.  Memory and ability to understand (cognition).  Work and work Astronomer.  Reproductive health. Screening  You may have the following tests or measurements:  Height, weight, and BMI.  Blood pressure.  Lipid and cholesterol levels. These may be checked every 5 years, or more frequently if you are over 36 years old.  Skin check.  Lung cancer screening. You may have this screening every year starting at age 59 if you have a 30-pack-year history of smoking and currently smoke or have quit within the past 15 years.  Fecal occult blood test (FOBT) of the stool. You may have this test every year starting at age 25.  Flexible sigmoidoscopy or colonoscopy. You may have a sigmoidoscopy every 5 years or a colonoscopy every 10 years starting at age 24.  Hepatitis C blood test.  Hepatitis B blood test.  Sexually transmitted disease (STD) testing.  Diabetes screening. This is done by checking your blood sugar (glucose) after you have not eaten for a while (fasting). You may have this done every 1-3 years.  Bone density scan. This is done to screen for osteoporosis. You may have this done starting at age 34.  Mammogram. This may be done every 1-2 years. Talk to your health care provider about how often you should have regular mammograms. Talk with your health care provider about your test results, treatment options, and if necessary, the need for more tests. Vaccines  Your health care provider may recommend certain vaccines, such as:  Influenza vaccine. This  is recommended every year.  Tetanus, diphtheria, and acellular pertussis (Tdap, Td) vaccine. You may need a Td booster every 10 years.  Zoster vaccine. You may need this after age 87.  Pneumococcal 13-valent  conjugate (PCV13) vaccine. One dose is recommended after age 43.  Pneumococcal polysaccharide (PPSV23) vaccine. One dose is recommended after age 80. Talk to your health care provider about which screenings and vaccines you need and how often you need them. This information is not intended to replace advice given to you by your health care provider. Make sure you discuss any questions you have with your health care provider. Document Released: 01/04/2016 Document Revised: 08/27/2016 Document Reviewed: 10/09/2015 Elsevier Interactive Patient Education  2017 [place] Prevention in the Home Falls can cause injuries. They can happen to people of all ages. There are many things you can do to make your home safe and to help prevent falls. What can I do on the outside of my home?  Regularly fix the edges of walkways and driveways and fix any cracks.  Remove anything that might make you trip as you walk through a door, such as a raised step or threshold.  Trim any bushes or trees on the path to your home.  Use bright outdoor lighting.  Clear any walking paths of anything that might make someone trip, such as rocks or tools.  Regularly check to see if handrails are loose or broken. Make sure that both sides of any steps have handrails.  Any raised decks and porches should have guardrails on the edges.  Have any leaves, snow, or ice cleared regularly.  Use sand or salt on walking paths during winter.  Clean up any spills in your garage right away. This includes oil or grease spills. What can I do in the bathroom?  Use night lights.  Install grab bars by the toilet and in the tub and shower. Do not use towel bars as grab bars.  Use non-skid mats or decals in the tub or shower.  If you need to sit down in the shower, use a plastic, non-slip stool.  Keep the floor dry. Clean up any water that spills on the floor as soon as it happens.  Remove soap buildup in the tub or  shower regularly.  Attach bath mats securely with double-sided non-slip rug tape.  Do not have throw rugs and other things on the floor that can make you trip. What can I do in the bedroom?  Use night lights.  Make sure that you have a light by your bed that is easy to reach.  Do not use any sheets or blankets that are too big for your bed. They should not hang down onto the floor.  Have a firm chair that has side arms. You can use this for support while you get dressed.  Do not have throw rugs and other things on the floor that can make you trip. What can I do in the kitchen?  Clean up any spills right away.  Avoid walking on wet floors.  Keep items that you use a lot in easy-to-reach places.  If you need to reach something above you, use a strong step stool that has a grab bar.  Keep electrical cords out of the way.  Do not use floor polish or wax that makes floors slippery. If you must use wax, use non-skid floor wax.  Do not have throw rugs and other things on the floor that can make you  trip. What can I do with my stairs?  Do not leave any items on the stairs.  Make sure that there are handrails on both sides of the stairs and use them. Fix handrails that are broken or loose. Make sure that handrails are as long as the stairways.  Check any carpeting to make sure that it is firmly attached to the stairs. Fix any carpet that is loose or worn.  Avoid having throw rugs at the top or bottom of the stairs. If you do have throw rugs, attach them to the floor with carpet tape.  Make sure that you have a light switch at the top of the stairs and the bottom of the stairs. If you do not have them, ask someone to add them for you. What else can I do to help prevent falls?  Wear shoes that:  Do not have high heels.  Have rubber bottoms.  Are comfortable and fit you well.  Are closed at the toe. Do not wear sandals.  If you use a stepladder:  Make sure that it is fully  opened. Do not climb a closed stepladder.  Make sure that both sides of the stepladder are locked into place.  Ask someone to hold it for you, if possible.  Clearly mark and make sure that you can see:  Any grab bars or handrails.  First and last steps.  Where the edge of each step is.  Use tools that help you move around (mobility aids) if they are needed. These include:  Canes.  Walkers.  Scooters.  Crutches.  Turn on the lights when you go into a dark area. Replace any light bulbs as soon as they burn out.  Set up your furniture so you have a clear path. Avoid moving your furniture around.  If any of your floors are uneven, fix them.  If there are any pets around you, be aware of where they are.  Review your medicines with your doctor. Some medicines can make you feel dizzy. This can increase your chance of falling. Ask your doctor what other things that you can do to help prevent falls. This information is not intended to replace advice given to you by your health care provider. Make sure you discuss any questions you have with your health care provider. Document Released: 10/04/2009 Document Revised: 05/15/2016 Document Reviewed: 01/12/2015 Elsevier Interactive Patient Education  2017 [employer].

## 2020-05-31 NOTE — Progress Notes (Signed)
This visit occurred during the SARS-CoV-2 public health emergency.  Safety protocols were in place, including screening questions prior to the visit, additional usage of staff PPE, and extensive cleaning of exam room while observing appropriate contact time as indicated for disinfecting solutions.  Subjective:   Allison Mills is a 86 y.o. female who presents for Medicare Annual (Subsequent) preventive examination.  Review of Systems:  n/a Cardiac Risk Factors include: advanced age (>7men, >19 women);hypertension;sedentary lifestyle     Objective:     Vitals: BP 138/72 (BP Location: Left Arm, Patient Position: Sitting, Cuff Size: Normal)   Pulse (!) 57   Temp 98.1 F (36.7 C) (Oral)   Ht 5\' 5"  (1.651 m)   Wt 161 lb (73 kg)   BMI 26.79 kg/m   Body mass index is 26.79 kg/m.  Advanced Directives 05/31/2020 11/24/2019 11/04/2018  Does Patient Have a Medical Advance Directive? No No No  Would patient like information on creating a medical advance directive? No - Patient declined No - Patient declined Yes (MAU/Ambulatory/Procedural Areas - Information given)    Tobacco Social History   Tobacco Use  Smoking Status Never Smoker  Smokeless Tobacco Never Used     Counseling given: Not Answered   Clinical Intake:  Pre-visit preparation completed: Yes  Pain : No/denies pain     Nutritional Status: BMI 25 -29 Overweight Nutritional Risks: None Diabetes: No  How often do you need to have someone help you when you read instructions, pamphlets, or other written materials from your doctor or pharmacy?: 1 - Never  Interpreter Needed?: No  Information entered by :: NAllen LPN  Past Medical History:  Diagnosis Date  . Coronary artery disease   . Hypertension   . Murmur   . RBBB (right bundle branch block)    History reviewed. No pertinent surgical history. Family History  Problem Relation Age of Onset  . Uterine cancer Mother   . Healthy Father   . Heart Problems  Other    Social History   Socioeconomic History  . Marital status: Widowed    Spouse name: Not on file  . Number of children: Not on file  . Years of education: Not on file  . Highest education level: Not on file  Occupational History  . Occupation: retired  Tobacco Use  . Smoking status: Never Smoker  . Smokeless tobacco: Never Used  Vaping Use  . Vaping Use: Never used  Substance and Sexual Activity  . Alcohol use: No  . Drug use: No  . Sexual activity: Not Currently  Other Topics Concern  . Not on file  Social History Narrative  . Not on file   Social Determinants of Health   Financial Resource Strain: Low Risk   . Difficulty of Paying Living Expenses: Not hard at all  Food Insecurity: No Food Insecurity  . Worried About [phone] in the Last Year: Never true  . Ran Out of Food in the Last Year: Never true  Transportation Needs: No Transportation Needs  . Lack of Transportation (Medical): No  . Lack of Transportation (Non-Medical): No  Physical Activity: Inactive  . Days of Exercise per Week: 0 days  . Minutes of Exercise per Session: 0 min  Stress: No Stress Concern Present  . Feeling of Stress : Only a little  Social Connections:   . Frequency of Communication with Friends and Family:   . Frequency of Social Gatherings with Friends and Family:   . Attends Religious  Services:   . Active Member of Clubs or Organizations:   . Attends Banker Meetings:   Marland Kitchen Marital Status:     Outpatient Encounter Medications as of 05/31/2020  Medication Sig  . bimatoprost (LUMIGAN) 0.01 % SOLN Place 1 drop into both eyes at bedtime.  . brimonidine (ALPHAGAN P) 0.1 % SOLN Place 1 drop into both eyes 3 (three) times daily.  . brinzolamide (AZOPT) 1 % ophthalmic suspension Place 1 drop into both eyes 3 (three) times daily.  . Cholecalciferol (VITAMIN D3) 50 MCG (2000 UT) capsule Take 2,000 Units by mouth daily.  . Magnesium 250 MG TABS Take 1 tablet (250 mg  total) by mouth daily.  . Multiple Vitamins-Minerals (SENIOR MULTIVITAMIN PLUS PO) Take by mouth.  . pneumococcal 13-valent conjugate vaccine (PREVNAR 13) SUSP injection Inject 0.5 mLs into the muscle tomorrow at 10 am for 1 dose.  . [DISCONTINUED] amLODipine (NORVASC) 2.5 MG tablet Take 1 tablet (2.5 mg total) by mouth daily.  . [DISCONTINUED] valsartan-hydrochlorothiazide (DIOVAN-HCT) 160-12.5 MG tablet Take 1 tablet by mouth daily.   No facility-administered encounter medications on file as of 05/31/2020.    Activities of Daily Living In your present state of health, do you have any difficulty performing the following activities: 05/31/2020 11/24/2019  Hearing? Malvin Johns  Comment wears hearing aides wears hearing aides  Vision? N N  Difficulty concentrating or making decisions? N N  Walking or climbing stairs? Y N  Comment - -  Dressing or bathing? N N  Doing errands, shopping? N N  Comment - -  Quarry manager and eating ? Y N  Comment lives in independent living -  Using the Toilet? N N  In the past six months, have you accidently leaked urine? N Y  Comment not sure only if holds too long  Do you have problems with loss of bowel control? Y N  Managing your Medications? N N  Managing your Finances? Alpha Gula  Comment family manages -  Housekeeping or managing your Housekeeping? Y N  Comment lives in independent living -  Some recent data might be hidden    Patient Care Team: Dorothyann Peng, MD as PCP - General (Internal Medicine)    Assessment:   This is a routine wellness examination for Allison Mills.  Exercise Activities and Dietary recommendations Current Exercise Habits: The patient does not participate in regular exercise at present  Goals    .  DIET - REDUCE SALT INTAKE TO 2 GRAMS PER DAY OR LESS (pt-stated)      Does not want to eat salty foods    .  Patient Stated      11/24/2019 no goals set at this time    .  Patient Stated      05/31/2020, no goals       Fall Risk Fall  Risk  05/31/2020 05/31/2020 11/24/2019 11/24/2019 06/14/2019  Falls in the past year? 1 0 0 0 0  Comment knee gave out - - - -  Number falls in past yr: 1 - - - -  Injury with Fall? 0 - - - -  Risk for fall due to : Medication side effect - Impaired mobility;Medication side effect - -  Follow up Falls evaluation completed;Education provided;Falls prevention discussed - Falls evaluation completed;Education provided;Falls prevention discussed - -   Is the patient's home free of loose throw rugs in walkways, pet beds, electrical cords, etc?   yes      Grab bars in the  bathroom? yes      Handrails on the stairs?   yes      Adequate lighting?   yes  Timed Get Up and Go performed: n/a  Depression Screen PHQ 2/9 Scores 05/31/2020 05/31/2020 11/24/2019 06/14/2019  PHQ - 2 Score 0 0 0 0     Cognitive Function     6CIT Screen 05/31/2020 11/24/2019 11/04/2018  What Year? 4 points 0 points 0 points  What month? 0 points 3 points 0 points  What time? 0 points 0 points 3 points  Count back from 20 0 points 0 points 0 points  Months in reverse 4 points 0 points 0 points  Repeat phrase 4 points 10 points 2 points  Total Score 12 13 5     Immunization History  Administered Date(s) Administered  . Influenza, High Dose Seasonal PF 11/04/2018, 09/27/2019  . Moderna SARS-COVID-2 Vaccination 01/09/2020, 02/06/2020    Qualifies for Shingles Vaccine? yes  Screening Tests Health Maintenance  Topic Date Due  . PNA vac Low Risk Adult (1 of 2 - PCV13) Never done  . TETANUS/TDAP  11/23/2020 (Originally 05-09-1935)  . INFLUENZA VACCINE  07/22/2020  . DEXA SCAN  Completed  . COVID-19 Vaccine  Completed    Cancer Screenings: Lung: Low Dose CT Chest recommended if Age 37-80 years, 30 pack-year currently smoking OR have quit w/in 15years. Patient does not qualify. Breast:  Up to date on Mammogram? Yes   Up to date of Bone Density/Dexa? Yes Colorectal: not required  Additional Screenings: : Hepatitis C  Screening: n/a     Plan:    Patient has no goals set at this time.   I have personally reviewed and noted the following in the patient's chart:   . Medical and social history . Use of alcohol, tobacco or illicit drugs  . Current medications and supplements . Functional ability and status . Nutritional status . Physical activity . Advanced directives . List of other physicians . Hospitalizations, surgeries, and ER visits in previous 12 months . Vitals . Screenings to include cognitive, depression, and falls . Referrals and appointments  In addition, I have reviewed and discussed with patient certain preventive protocols, quality metrics, and best practice recommendations. A written personalized care plan for preventive services as well as general preventive health recommendations were provided to patient.     Kellie Simmering, LPN  06/12/2978

## 2020-05-31 NOTE — Progress Notes (Signed)
°This visit occurred during the SARS-CoV-2 public health emergency.  Safety protocols were in place, including screening questions prior to the visit, additional usage of staff PPE, and extensive cleaning of exam room while observing appropriate contact time as indicated for disinfecting solutions. ° °Subjective:  °  ° Patient ID: Allison Mills , female    DOB: 04/30/1916 , 86 y.o.   MRN: [id number] ° ° °Chief Complaint  °Patient presents with  °• Hypertension  ° ° °HPI ° °She is here today for bp check.  She is accompanied by her daughter today. She reports compliance with medications.  °She is currently in independent living. In the past 6 months has been able to get up and go to the dining room. Two times she has stood up and said her knee gave out and slid to the floor without any injuries. The patient called her grandson to help get her up. She is not walking as much since that time, she has been using over the counter voltaren.  She is living at [facility], has PT there as well in house.   ° °Her daughter is concerned about her independence. She does have someone to check on her a few days a week. ° °Hypertension °This is a chronic problem. The current episode started more than 1 year ago. The problem has been gradually improving since onset. The problem is controlled. Associated symptoms include peripheral edema. Pertinent negatives include no blurred vision or neck pain. Risk factors for coronary artery disease include post-menopausal state. Past treatments include calcium channel blockers. Hypertensive end-organ damage includes kidney disease. Identifiable causes of hypertension include chronic renal disease.  °  ° °Past Medical History:  °Diagnosis Date  °• Coronary artery disease   °• Hypertension   °• Murmur   °• RBBB (right bundle branch block)   °  ° °Family History  °Problem Relation Age of Onset  °• Uterine cancer Mother   °• Healthy Father   °• Heart Problems Other   ° ° ° °Current Outpatient  Medications:  °•  amLODipine (NORVASC) 2.5 MG tablet, Take 1 tablet (2.5 mg total) by mouth daily., Disp: 90 tablet, Rfl: 1 °•  bimatoprost (LUMIGAN) 0.01 % SOLN, Place 1 drop into both eyes at bedtime., Disp: 7.5 mL, Rfl: 1 °•  brimonidine (ALPHAGAN P) 0.1 % SOLN, Place 1 drop into both eyes 3 (three) times daily., Disp: , Rfl:  °•  brinzolamide (AZOPT) 1 % ophthalmic suspension, Place 1 drop into both eyes 3 (three) times daily., Disp: , Rfl:  °•  Cholecalciferol (VITAMIN D3) 50 MCG (2000 UT) capsule, Take 2,000 Units by mouth daily., Disp: , Rfl:  °•  Magnesium 250 MG TABS, Take 1 tablet (250 mg total) by mouth daily., Disp: 90 tablet, Rfl: 0 °•  Multiple Vitamins-Minerals (SENIOR MULTIVITAMIN PLUS PO), Take by mouth., Disp: , Rfl:  °•  valsartan-hydrochlorothiazide (DIOVAN-HCT) 160-12.5 MG tablet, Take 1 tablet by mouth daily., Disp: 90 tablet, Rfl: 2 °•  pneumococcal 13-valent conjugate vaccine (PREVNAR 13) SUSP injection, Inject 0.5 mLs into the muscle tomorrow at 10 am for 1 dose., Disp: 0.5 mL, Rfl: 0  ° °No Known Allergies  ° °Review of Systems  °Constitutional: Negative.   °Eyes: Negative for blurred vision.  °Respiratory: Negative.   °Cardiovascular: Negative.   °Gastrointestinal: Negative.   °Musculoskeletal: Positive for arthralgias. Negative for neck pain.  °     She c/o right hand pain. Denies fall/trauma. Her hand is stiff upon awakening.   °  Neurological: Negative.   °Psychiatric/Behavioral: Negative.   °  ° °Today's Vitals  ° 05/31/20 1115  °BP: 138/72  °Pulse: (!) 57  °Temp: 98.1 °F (36.7 °C)  °TempSrc: Oral  °Weight: 161 lb (73 kg)  °PainSc: 5   °PainLoc: Knee  ° °Body mass index is 26.79 kg/m².  ° °Objective:  °Physical Exam °Vitals and nursing note reviewed.  °Constitutional:   °   Appearance: Normal appearance.  °HENT:  °   Head: Normocephalic and atraumatic.  °Cardiovascular:  °   Rate and Rhythm: Normal rate and regular rhythm.  °   Heart sounds: Normal heart sounds.  °Pulmonary:  °   Effort:  Pulmonary effort is normal.  °   Breath sounds: Normal breath sounds.  °Musculoskeletal:  °   Right lower leg: 1+ Pitting Edema present.  °   Left lower leg: 1+ Pitting Edema present.  °   Comments: Mild crepitus to left knee laterally  °Skin: °   General: Skin is warm.  °Neurological:  °   General: No focal deficit present.  °   Mental Status: She is alert.  °Psychiatric:     °   Mood and Affect: Mood normal.     °   Behavior: Behavior normal.  ° °  ° °   °Assessment And Plan:  °   °1. Hypertensive nephropathy °· Chronic, stable  °· Continue with current medications °- amLODipine (NORVASC) 2.5 MG tablet; Take 1 tablet (2.5 mg total) by mouth daily.  Dispense: 90 tablet; Refill: 1 °- valsartan-hydrochlorothiazide (DIOVAN-HCT) 160-12.5 MG tablet; Take 1 tablet by mouth daily.  Dispense: 90 tablet; Refill: 2 °- [id number]+EGFR °- CBC ° °2. Chronic renal disease, stage II °· Avoid NSAIDs and stay well hydrated with water °- amLODipine (NORVASC) 2.5 MG tablet; Take 1 tablet (2.5 mg total) by mouth daily.  Dispense: 90 tablet; Refill: 1 °- valsartan-hydrochlorothiazide (DIOVAN-HCT) 160-12.5 MG tablet; Take 1 tablet by mouth daily.  Dispense: 90 tablet; Refill: 2 ° °3. Fall, initial encounter °· Has had 2 episodes of falling to the floor without significant injury °· Daughter is interested in seeing what resources her mother has available as she is concerned about her mothers safety and ability to do on her own °· Daughter is interested in PT at [facility] we have a call out to them to find out how to submit an order °- Referral to [facility] ° °4. Acute pain of left knee °· Mild crepitus noted to left knee °- Referral to [facility] Care Management Services ° °5. Other long term (current) drug therapy ° °- CBC ° °6. Overweight with body mass index (BMI) of 26 to 26.9 in adult °· Stable ° ° °Janece Moore, FNP  ° ° °THE PATIENT IS ENCOURAGED TO PRACTICE SOCIAL DISTANCING DUE TO THE COVID-19 PANDEMIC.   °

## 2020-06-01 LAB — CMP14+EGFR
ALT: 11 IU/L (ref 0–32)
AST: 15 IU/L (ref 0–40)
Albumin/Globulin Ratio: 1.6 (ref 1.2–2.2)
Albumin: 3.9 g/dL (ref 3.5–4.6)
Alkaline Phosphatase: 62 IU/L (ref 48–121)
BUN/Creatinine Ratio: 32 — ABNORMAL HIGH (ref 12–28)
BUN: 25 mg/dL (ref 10–36)
Bilirubin Total: 0.4 mg/dL (ref 0.0–1.2)
CO2: 24 mmol/L (ref 20–29)
Calcium: 9.6 mg/dL (ref 8.7–10.3)
Chloride: 104 mmol/L (ref 96–106)
Creatinine, Ser: 0.78 mg/dL (ref 0.57–1.00)
GFR calc Af Amer: 72 mL/min/{1.73_m2} (ref >59)
GFR calc non Af Amer: 62 mL/min/{1.73_m2} (ref >59)
Globulin, Total: 2.4 g/dL (ref 1.5–4.5)
Glucose: 101 mg/dL — ABNORMAL HIGH (ref 65–99)
Potassium: 4.4 mmol/L (ref 3.5–5.2)
Sodium: 142 mmol/L (ref 134–144)
Total Protein: 6.3 g/dL (ref 6.0–8.5)

## 2020-06-01 LAB — CBC
Hematocrit: 39.8 % (ref 34.0–46.6)
Hemoglobin: 13.3 g/dL (ref 11.1–15.9)
MCH: 31.3 pg (ref 26.6–33.0)
MCHC: 33.4 g/dL (ref 31.5–35.7)
MCV: 94 fL (ref 79–97)
Platelets: 189 10*3/uL (ref 150–450)
RBC: 4.25 x10E6/uL (ref 3.77–5.28)
RDW: 13.2 % (ref 11.7–15.4)
WBC: 3.2 10*3/uL — ABNORMAL LOW (ref 3.4–10.8)

## 2020-06-04 ENCOUNTER — Telehealth: Payer: Self-pay

## 2020-06-04 NOTE — Telephone Encounter (Signed)
Patient's daughter has been notified YL,RMA

## 2020-06-06 ENCOUNTER — Ambulatory Visit: Payer: Self-pay

## 2020-06-06 ENCOUNTER — Other Ambulatory Visit: Payer: Self-pay | Admitting: Internal Medicine

## 2020-06-06 DIAGNOSIS — I129 Hypertensive chronic kidney disease with stage 1 through stage 4 chronic kidney disease, or unspecified chronic kidney disease: Secondary | ICD-10-CM

## 2020-06-06 DIAGNOSIS — Z1159 Encounter for screening for other viral diseases: Secondary | ICD-10-CM | POA: Diagnosis not present

## 2020-06-06 DIAGNOSIS — N182 Chronic kidney disease, stage 2 (mild): Secondary | ICD-10-CM

## 2020-06-06 DIAGNOSIS — Z20828 Contact with and (suspected) exposure to other viral communicable diseases: Secondary | ICD-10-CM | POA: Diagnosis not present

## 2020-06-06 NOTE — Chronic Care Management (AMB) (Signed)
  Chronic Care Management   Outreach Note  06/06/2020 Name: Allison Mills MRN: [id number] DOB: 20-May-1916  Referred by: Arnette Felts, FNP Patient primary provider: Dorothyann Peng, MD Reason for referral : Care Coordination  An unsuccessful telephone outreach was attempted today. The patient was referred to the case management team for assistance with care management and care coordination.   Follow Up Plan: A HIPPA compliant phone message was left for the patients daughter, as indicated in referral notes, providing contact information and requesting a return call.  The care management team will reach out to the patient again over the next 10 days.   Bevelyn Ngo, BSW, CDP Social Worker, Certified Dementia Practitioner [facility] Care Management [phone]

## 2020-06-11 ENCOUNTER — Ambulatory Visit: Payer: Self-pay

## 2020-06-11 DIAGNOSIS — N182 Chronic kidney disease, stage 2 (mild): Secondary | ICD-10-CM

## 2020-06-11 DIAGNOSIS — I129 Hypertensive chronic kidney disease with stage 1 through stage 4 chronic kidney disease, or unspecified chronic kidney disease: Secondary | ICD-10-CM

## 2020-06-11 NOTE — Chronic Care Management (AMB) (Signed)
°  Chronic Care Management   Outreach Note  06/11/2020 Name: Allison Mills MRN: [id number] DOB: 1916/10/10  Referred by: Dorothyann Peng, MD Reason for referral : Care Coordination   A second unsuccessful telephone outreach was attempted today. The patient was referred to the case management team for assistance with care management and care coordination.   Follow Up Plan: A HIPPA compliant phone message was left for the patient's daughter providing contact information and requesting a return call.  The care management team will reach out to the patient again over the next 14 days.   Bevelyn Ngo, BSW, CDP Social Worker, Certified Dementia Practitioner [facility] Care Management [phone]

## 2020-06-13 DIAGNOSIS — R2689 Other abnormalities of gait and mobility: Secondary | ICD-10-CM | POA: Diagnosis not present

## 2020-06-13 DIAGNOSIS — Z1159 Encounter for screening for other viral diseases: Secondary | ICD-10-CM | POA: Diagnosis not present

## 2020-06-13 DIAGNOSIS — Z20828 Contact with and (suspected) exposure to other viral communicable diseases: Secondary | ICD-10-CM | POA: Diagnosis not present

## 2020-06-13 DIAGNOSIS — M6281 Muscle weakness (generalized): Secondary | ICD-10-CM | POA: Diagnosis not present

## 2020-06-15 DIAGNOSIS — M6281 Muscle weakness (generalized): Secondary | ICD-10-CM | POA: Diagnosis not present

## 2020-06-15 DIAGNOSIS — R2689 Other abnormalities of gait and mobility: Secondary | ICD-10-CM | POA: Diagnosis not present

## 2020-06-15 DIAGNOSIS — R296 Repeated falls: Secondary | ICD-10-CM | POA: Diagnosis not present

## 2020-06-20 ENCOUNTER — Ambulatory Visit: Payer: Self-pay

## 2020-06-20 DIAGNOSIS — Z20828 Contact with and (suspected) exposure to other viral communicable diseases: Secondary | ICD-10-CM | POA: Diagnosis not present

## 2020-06-20 DIAGNOSIS — I129 Hypertensive chronic kidney disease with stage 1 through stage 4 chronic kidney disease, or unspecified chronic kidney disease: Secondary | ICD-10-CM

## 2020-06-20 DIAGNOSIS — N182 Chronic kidney disease, stage 2 (mild): Secondary | ICD-10-CM

## 2020-06-20 DIAGNOSIS — Z1159 Encounter for screening for other viral diseases: Secondary | ICD-10-CM | POA: Diagnosis not present

## 2020-06-20 NOTE — Chronic Care Management (AMB) (Signed)
°  Chronic Care Management   Outreach Note  06/20/2020 Name: Allison Mills MRN: [id number] DOB: 1916-01-26  Referred by: Dorothyann Peng, MD Reason for referral : Care Coordination   Third unsuccessful telephone outreach was attempted today. The patient was referred to the case management team for assistance with care management and care coordination. The patient's primary care provider has been notified of our unsuccessful attempts to make or maintain contact with the patient. The care management team is pleased to engage with this patient at any time in the future should he/she be interested in assistance from the care management team.   Follow Up Plan: SW left a HIPAA compliant voice message for the patients daughter requesting a return call. Referral will be closed due to inability to establish contact. SW is available to assist with future care coordination needs should SW receive a returned call.  Bevelyn Ngo, BSW, CDP Social Worker, Certified Dementia Practitioner [facility] Care Management [phone]

## 2020-06-21 DIAGNOSIS — R2689 Other abnormalities of gait and mobility: Secondary | ICD-10-CM | POA: Diagnosis not present

## 2020-06-21 DIAGNOSIS — M6281 Muscle weakness (generalized): Secondary | ICD-10-CM | POA: Diagnosis not present

## 2020-06-21 DIAGNOSIS — R296 Repeated falls: Secondary | ICD-10-CM | POA: Diagnosis not present

## 2020-06-22 ENCOUNTER — Ambulatory Visit: Payer: Self-pay

## 2020-06-22 DIAGNOSIS — N182 Chronic kidney disease, stage 2 (mild): Secondary | ICD-10-CM

## 2020-06-22 DIAGNOSIS — M6281 Muscle weakness (generalized): Secondary | ICD-10-CM | POA: Diagnosis not present

## 2020-06-22 DIAGNOSIS — R296 Repeated falls: Secondary | ICD-10-CM | POA: Diagnosis not present

## 2020-06-22 DIAGNOSIS — I129 Hypertensive chronic kidney disease with stage 1 through stage 4 chronic kidney disease, or unspecified chronic kidney disease: Secondary | ICD-10-CM

## 2020-06-22 NOTE — Chronic Care Management (AMB) (Signed)
  Chronic Care Management   Outreach Note  06/22/2020 Name: Allison Mills MRN: [id number] DOB: [birth year]  Referred by: Dorothyann Peng, MD Reason for referral : Care Coordination   SW received voice message from the patients daughter requesting information on caregiver resources. SW placed unsuccessful outbound call to the patients daughter. SW left a HIPAA compliant voice message requesting a return call.  Follow Up Plan: No follow up planned at this time. SW has placed 4 unsuccessful calls to the patient daughter since receiving care management referral. SW is available to assist with care coordination needs if the patient and/or her family return SW call.  Bevelyn Ngo, BSW, CDP Social Worker, Certified Dementia Practitioner [facility] Care Management [phone]

## 2020-06-25 DIAGNOSIS — R296 Repeated falls: Secondary | ICD-10-CM | POA: Diagnosis not present

## 2020-06-25 DIAGNOSIS — M6281 Muscle weakness (generalized): Secondary | ICD-10-CM | POA: Diagnosis not present

## 2020-06-26 DIAGNOSIS — M6281 Muscle weakness (generalized): Secondary | ICD-10-CM | POA: Diagnosis not present

## 2020-06-26 DIAGNOSIS — R296 Repeated falls: Secondary | ICD-10-CM | POA: Diagnosis not present

## 2020-06-27 DIAGNOSIS — M6281 Muscle weakness (generalized): Secondary | ICD-10-CM | POA: Diagnosis not present

## 2020-06-27 DIAGNOSIS — R2689 Other abnormalities of gait and mobility: Secondary | ICD-10-CM | POA: Diagnosis not present

## 2020-06-27 DIAGNOSIS — Z1159 Encounter for screening for other viral diseases: Secondary | ICD-10-CM | POA: Diagnosis not present

## 2020-06-27 DIAGNOSIS — Z20828 Contact with and (suspected) exposure to other viral communicable diseases: Secondary | ICD-10-CM | POA: Diagnosis not present

## 2020-06-28 DIAGNOSIS — R296 Repeated falls: Secondary | ICD-10-CM | POA: Diagnosis not present

## 2020-06-28 DIAGNOSIS — M6281 Muscle weakness (generalized): Secondary | ICD-10-CM | POA: Diagnosis not present

## 2020-06-29 DIAGNOSIS — R2689 Other abnormalities of gait and mobility: Secondary | ICD-10-CM | POA: Diagnosis not present

## 2020-06-29 DIAGNOSIS — M6281 Muscle weakness (generalized): Secondary | ICD-10-CM | POA: Diagnosis not present

## 2020-07-02 DIAGNOSIS — M6281 Muscle weakness (generalized): Secondary | ICD-10-CM | POA: Diagnosis not present

## 2020-07-02 DIAGNOSIS — R2689 Other abnormalities of gait and mobility: Secondary | ICD-10-CM | POA: Diagnosis not present

## 2020-07-03 DIAGNOSIS — R296 Repeated falls: Secondary | ICD-10-CM | POA: Diagnosis not present

## 2020-07-03 DIAGNOSIS — M6281 Muscle weakness (generalized): Secondary | ICD-10-CM | POA: Diagnosis not present

## 2020-07-04 DIAGNOSIS — Z1159 Encounter for screening for other viral diseases: Secondary | ICD-10-CM | POA: Diagnosis not present

## 2020-07-04 DIAGNOSIS — R2689 Other abnormalities of gait and mobility: Secondary | ICD-10-CM | POA: Diagnosis not present

## 2020-07-04 DIAGNOSIS — M6281 Muscle weakness (generalized): Secondary | ICD-10-CM | POA: Diagnosis not present

## 2020-07-04 DIAGNOSIS — Z20828 Contact with and (suspected) exposure to other viral communicable diseases: Secondary | ICD-10-CM | POA: Diagnosis not present

## 2020-07-05 DIAGNOSIS — M6281 Muscle weakness (generalized): Secondary | ICD-10-CM | POA: Diagnosis not present

## 2020-07-05 DIAGNOSIS — R296 Repeated falls: Secondary | ICD-10-CM | POA: Diagnosis not present

## 2020-07-06 DIAGNOSIS — M6281 Muscle weakness (generalized): Secondary | ICD-10-CM | POA: Diagnosis not present

## 2020-07-06 DIAGNOSIS — R2689 Other abnormalities of gait and mobility: Secondary | ICD-10-CM | POA: Diagnosis not present

## 2020-07-09 DIAGNOSIS — R296 Repeated falls: Secondary | ICD-10-CM | POA: Diagnosis not present

## 2020-07-09 DIAGNOSIS — M6281 Muscle weakness (generalized): Secondary | ICD-10-CM | POA: Diagnosis not present

## 2020-07-11 DIAGNOSIS — Z1159 Encounter for screening for other viral diseases: Secondary | ICD-10-CM | POA: Diagnosis not present

## 2020-07-11 DIAGNOSIS — M6281 Muscle weakness (generalized): Secondary | ICD-10-CM | POA: Diagnosis not present

## 2020-07-11 DIAGNOSIS — R296 Repeated falls: Secondary | ICD-10-CM | POA: Diagnosis not present

## 2020-07-11 DIAGNOSIS — Z20828 Contact with and (suspected) exposure to other viral communicable diseases: Secondary | ICD-10-CM | POA: Diagnosis not present

## 2020-07-12 DIAGNOSIS — M6281 Muscle weakness (generalized): Secondary | ICD-10-CM | POA: Diagnosis not present

## 2020-07-12 DIAGNOSIS — R2689 Other abnormalities of gait and mobility: Secondary | ICD-10-CM | POA: Diagnosis not present

## 2020-07-13 DIAGNOSIS — M6281 Muscle weakness (generalized): Secondary | ICD-10-CM | POA: Diagnosis not present

## 2020-07-13 DIAGNOSIS — R296 Repeated falls: Secondary | ICD-10-CM | POA: Diagnosis not present

## 2020-07-16 DIAGNOSIS — R296 Repeated falls: Secondary | ICD-10-CM | POA: Diagnosis not present

## 2020-07-16 DIAGNOSIS — M6281 Muscle weakness (generalized): Secondary | ICD-10-CM | POA: Diagnosis not present

## 2020-07-17 DIAGNOSIS — M6281 Muscle weakness (generalized): Secondary | ICD-10-CM | POA: Diagnosis not present

## 2020-07-17 DIAGNOSIS — R2689 Other abnormalities of gait and mobility: Secondary | ICD-10-CM | POA: Diagnosis not present

## 2020-07-18 DIAGNOSIS — Z20828 Contact with and (suspected) exposure to other viral communicable diseases: Secondary | ICD-10-CM | POA: Diagnosis not present

## 2020-07-18 DIAGNOSIS — Z1159 Encounter for screening for other viral diseases: Secondary | ICD-10-CM | POA: Diagnosis not present

## 2020-07-18 DIAGNOSIS — R2689 Other abnormalities of gait and mobility: Secondary | ICD-10-CM | POA: Diagnosis not present

## 2020-07-18 DIAGNOSIS — M6281 Muscle weakness (generalized): Secondary | ICD-10-CM | POA: Diagnosis not present

## 2020-07-19 DIAGNOSIS — R2689 Other abnormalities of gait and mobility: Secondary | ICD-10-CM | POA: Diagnosis not present

## 2020-07-19 DIAGNOSIS — M6281 Muscle weakness (generalized): Secondary | ICD-10-CM | POA: Diagnosis not present

## 2020-07-20 DIAGNOSIS — M6281 Muscle weakness (generalized): Secondary | ICD-10-CM | POA: Diagnosis not present

## 2020-07-20 DIAGNOSIS — R296 Repeated falls: Secondary | ICD-10-CM | POA: Diagnosis not present

## 2020-07-23 DIAGNOSIS — M6281 Muscle weakness (generalized): Secondary | ICD-10-CM | POA: Diagnosis not present

## 2020-07-23 DIAGNOSIS — R296 Repeated falls: Secondary | ICD-10-CM | POA: Diagnosis not present

## 2020-07-24 DIAGNOSIS — R2689 Other abnormalities of gait and mobility: Secondary | ICD-10-CM | POA: Diagnosis not present

## 2020-07-24 DIAGNOSIS — M6281 Muscle weakness (generalized): Secondary | ICD-10-CM | POA: Diagnosis not present

## 2020-07-25 DIAGNOSIS — Z1159 Encounter for screening for other viral diseases: Secondary | ICD-10-CM | POA: Diagnosis not present

## 2020-07-25 DIAGNOSIS — Z20828 Contact with and (suspected) exposure to other viral communicable diseases: Secondary | ICD-10-CM | POA: Diagnosis not present

## 2020-07-26 DIAGNOSIS — M6281 Muscle weakness (generalized): Secondary | ICD-10-CM | POA: Diagnosis not present

## 2020-07-26 DIAGNOSIS — R2689 Other abnormalities of gait and mobility: Secondary | ICD-10-CM | POA: Diagnosis not present

## 2020-07-27 DIAGNOSIS — R296 Repeated falls: Secondary | ICD-10-CM | POA: Diagnosis not present

## 2020-07-27 DIAGNOSIS — M6281 Muscle weakness (generalized): Secondary | ICD-10-CM | POA: Diagnosis not present

## 2020-07-30 DIAGNOSIS — M6281 Muscle weakness (generalized): Secondary | ICD-10-CM | POA: Diagnosis not present

## 2020-07-30 DIAGNOSIS — R2689 Other abnormalities of gait and mobility: Secondary | ICD-10-CM | POA: Diagnosis not present

## 2020-07-31 DIAGNOSIS — R296 Repeated falls: Secondary | ICD-10-CM | POA: Diagnosis not present

## 2020-07-31 DIAGNOSIS — M6281 Muscle weakness (generalized): Secondary | ICD-10-CM | POA: Diagnosis not present

## 2020-08-01 DIAGNOSIS — Z1159 Encounter for screening for other viral diseases: Secondary | ICD-10-CM | POA: Diagnosis not present

## 2020-08-01 DIAGNOSIS — Z20828 Contact with and (suspected) exposure to other viral communicable diseases: Secondary | ICD-10-CM | POA: Diagnosis not present

## 2020-08-01 DIAGNOSIS — M6281 Muscle weakness (generalized): Secondary | ICD-10-CM | POA: Diagnosis not present

## 2020-08-01 DIAGNOSIS — R2689 Other abnormalities of gait and mobility: Secondary | ICD-10-CM | POA: Diagnosis not present

## 2020-08-02 DIAGNOSIS — M6281 Muscle weakness (generalized): Secondary | ICD-10-CM | POA: Diagnosis not present

## 2020-08-02 DIAGNOSIS — R296 Repeated falls: Secondary | ICD-10-CM | POA: Diagnosis not present

## 2020-08-03 DIAGNOSIS — R2689 Other abnormalities of gait and mobility: Secondary | ICD-10-CM | POA: Diagnosis not present

## 2020-08-03 DIAGNOSIS — M6281 Muscle weakness (generalized): Secondary | ICD-10-CM | POA: Diagnosis not present

## 2020-08-06 DIAGNOSIS — R296 Repeated falls: Secondary | ICD-10-CM | POA: Diagnosis not present

## 2020-08-06 DIAGNOSIS — M6281 Muscle weakness (generalized): Secondary | ICD-10-CM | POA: Diagnosis not present

## 2020-08-07 DIAGNOSIS — R2689 Other abnormalities of gait and mobility: Secondary | ICD-10-CM | POA: Diagnosis not present

## 2020-08-07 DIAGNOSIS — M6281 Muscle weakness (generalized): Secondary | ICD-10-CM | POA: Diagnosis not present

## 2020-08-08 DIAGNOSIS — Z20828 Contact with and (suspected) exposure to other viral communicable diseases: Secondary | ICD-10-CM | POA: Diagnosis not present

## 2020-08-08 DIAGNOSIS — R296 Repeated falls: Secondary | ICD-10-CM | POA: Diagnosis not present

## 2020-08-08 DIAGNOSIS — M6281 Muscle weakness (generalized): Secondary | ICD-10-CM | POA: Diagnosis not present

## 2020-08-08 DIAGNOSIS — Z1159 Encounter for screening for other viral diseases: Secondary | ICD-10-CM | POA: Diagnosis not present

## 2020-08-10 DIAGNOSIS — R296 Repeated falls: Secondary | ICD-10-CM | POA: Diagnosis not present

## 2020-08-10 DIAGNOSIS — M6281 Muscle weakness (generalized): Secondary | ICD-10-CM | POA: Diagnosis not present

## 2020-08-14 DIAGNOSIS — R296 Repeated falls: Secondary | ICD-10-CM | POA: Diagnosis not present

## 2020-08-14 DIAGNOSIS — M6281 Muscle weakness (generalized): Secondary | ICD-10-CM | POA: Diagnosis not present

## 2020-08-15 DIAGNOSIS — Z1159 Encounter for screening for other viral diseases: Secondary | ICD-10-CM | POA: Diagnosis not present

## 2020-08-15 DIAGNOSIS — Z20828 Contact with and (suspected) exposure to other viral communicable diseases: Secondary | ICD-10-CM | POA: Diagnosis not present

## 2020-08-21 DIAGNOSIS — M6281 Muscle weakness (generalized): Secondary | ICD-10-CM | POA: Diagnosis not present

## 2020-08-21 DIAGNOSIS — R296 Repeated falls: Secondary | ICD-10-CM | POA: Diagnosis not present

## 2020-08-22 DIAGNOSIS — R296 Repeated falls: Secondary | ICD-10-CM | POA: Diagnosis not present

## 2020-08-22 DIAGNOSIS — M6281 Muscle weakness (generalized): Secondary | ICD-10-CM | POA: Diagnosis not present

## 2020-08-23 DIAGNOSIS — R296 Repeated falls: Secondary | ICD-10-CM | POA: Diagnosis not present

## 2020-08-23 DIAGNOSIS — M6281 Muscle weakness (generalized): Secondary | ICD-10-CM | POA: Diagnosis not present

## 2020-08-29 DIAGNOSIS — Z1159 Encounter for screening for other viral diseases: Secondary | ICD-10-CM | POA: Diagnosis not present

## 2020-08-29 DIAGNOSIS — Z20828 Contact with and (suspected) exposure to other viral communicable diseases: Secondary | ICD-10-CM | POA: Diagnosis not present

## 2020-09-05 DIAGNOSIS — Z20828 Contact with and (suspected) exposure to other viral communicable diseases: Secondary | ICD-10-CM | POA: Diagnosis not present

## 2020-09-05 DIAGNOSIS — Z1159 Encounter for screening for other viral diseases: Secondary | ICD-10-CM | POA: Diagnosis not present

## 2020-09-11 DIAGNOSIS — Z1159 Encounter for screening for other viral diseases: Secondary | ICD-10-CM | POA: Diagnosis not present

## 2020-09-11 DIAGNOSIS — Z20828 Contact with and (suspected) exposure to other viral communicable diseases: Secondary | ICD-10-CM | POA: Diagnosis not present

## 2020-09-14 DIAGNOSIS — Z1159 Encounter for screening for other viral diseases: Secondary | ICD-10-CM | POA: Diagnosis not present

## 2020-09-14 DIAGNOSIS — Z20828 Contact with and (suspected) exposure to other viral communicable diseases: Secondary | ICD-10-CM | POA: Diagnosis not present

## 2020-09-18 DIAGNOSIS — Z20828 Contact with and (suspected) exposure to other viral communicable diseases: Secondary | ICD-10-CM | POA: Diagnosis not present

## 2020-09-18 DIAGNOSIS — Z1159 Encounter for screening for other viral diseases: Secondary | ICD-10-CM | POA: Diagnosis not present

## 2020-09-21 DIAGNOSIS — Z20828 Contact with and (suspected) exposure to other viral communicable diseases: Secondary | ICD-10-CM | POA: Diagnosis not present

## 2020-09-21 DIAGNOSIS — Z1159 Encounter for screening for other viral diseases: Secondary | ICD-10-CM | POA: Diagnosis not present

## 2020-09-25 DIAGNOSIS — Z1159 Encounter for screening for other viral diseases: Secondary | ICD-10-CM | POA: Diagnosis not present

## 2020-09-25 DIAGNOSIS — Z20828 Contact with and (suspected) exposure to other viral communicable diseases: Secondary | ICD-10-CM | POA: Diagnosis not present

## 2020-09-28 DIAGNOSIS — Z1159 Encounter for screening for other viral diseases: Secondary | ICD-10-CM | POA: Diagnosis not present

## 2020-09-28 DIAGNOSIS — Z20828 Contact with and (suspected) exposure to other viral communicable diseases: Secondary | ICD-10-CM | POA: Diagnosis not present

## 2020-10-02 DIAGNOSIS — Z20828 Contact with and (suspected) exposure to other viral communicable diseases: Secondary | ICD-10-CM | POA: Diagnosis not present

## 2020-10-02 DIAGNOSIS — Z1159 Encounter for screening for other viral diseases: Secondary | ICD-10-CM | POA: Diagnosis not present

## 2020-10-05 DIAGNOSIS — Z20828 Contact with and (suspected) exposure to other viral communicable diseases: Secondary | ICD-10-CM | POA: Diagnosis not present

## 2020-10-05 DIAGNOSIS — Z1159 Encounter for screening for other viral diseases: Secondary | ICD-10-CM | POA: Diagnosis not present

## 2020-10-09 DIAGNOSIS — Z20828 Contact with and (suspected) exposure to other viral communicable diseases: Secondary | ICD-10-CM | POA: Diagnosis not present

## 2020-10-09 DIAGNOSIS — Z1159 Encounter for screening for other viral diseases: Secondary | ICD-10-CM | POA: Diagnosis not present

## 2020-10-16 DIAGNOSIS — Z20828 Contact with and (suspected) exposure to other viral communicable diseases: Secondary | ICD-10-CM | POA: Diagnosis not present

## 2020-10-16 DIAGNOSIS — Z1159 Encounter for screening for other viral diseases: Secondary | ICD-10-CM | POA: Diagnosis not present

## 2020-10-17 DIAGNOSIS — Z961 Presence of intraocular lens: Secondary | ICD-10-CM | POA: Diagnosis not present

## 2020-10-17 DIAGNOSIS — H16223 Keratoconjunctivitis sicca, not specified as Sjogren's, bilateral: Secondary | ICD-10-CM | POA: Diagnosis not present

## 2020-10-17 DIAGNOSIS — H401133 Primary open-angle glaucoma, bilateral, severe stage: Secondary | ICD-10-CM | POA: Diagnosis not present

## 2020-10-17 DIAGNOSIS — H43813 Vitreous degeneration, bilateral: Secondary | ICD-10-CM | POA: Diagnosis not present

## 2020-10-17 DIAGNOSIS — H02105 Unspecified ectropion of left lower eyelid: Secondary | ICD-10-CM | POA: Diagnosis not present

## 2020-10-17 DIAGNOSIS — H2512 Age-related nuclear cataract, left eye: Secondary | ICD-10-CM | POA: Diagnosis not present

## 2020-10-17 DIAGNOSIS — H47323 Drusen of optic disc, bilateral: Secondary | ICD-10-CM | POA: Diagnosis not present

## 2020-10-19 DIAGNOSIS — Z20828 Contact with and (suspected) exposure to other viral communicable diseases: Secondary | ICD-10-CM | POA: Diagnosis not present

## 2020-10-19 DIAGNOSIS — Z1159 Encounter for screening for other viral diseases: Secondary | ICD-10-CM | POA: Diagnosis not present

## 2020-10-23 DIAGNOSIS — Z20828 Contact with and (suspected) exposure to other viral communicable diseases: Secondary | ICD-10-CM | POA: Diagnosis not present

## 2020-10-23 DIAGNOSIS — Z1159 Encounter for screening for other viral diseases: Secondary | ICD-10-CM | POA: Diagnosis not present

## 2020-10-26 DIAGNOSIS — Z1159 Encounter for screening for other viral diseases: Secondary | ICD-10-CM | POA: Diagnosis not present

## 2020-10-26 DIAGNOSIS — Z20828 Contact with and (suspected) exposure to other viral communicable diseases: Secondary | ICD-10-CM | POA: Diagnosis not present

## 2020-11-02 DIAGNOSIS — Z20828 Contact with and (suspected) exposure to other viral communicable diseases: Secondary | ICD-10-CM | POA: Diagnosis not present

## 2020-11-02 DIAGNOSIS — Z1159 Encounter for screening for other viral diseases: Secondary | ICD-10-CM | POA: Diagnosis not present

## 2020-11-06 DIAGNOSIS — Z20828 Contact with and (suspected) exposure to other viral communicable diseases: Secondary | ICD-10-CM | POA: Diagnosis not present

## 2020-11-06 DIAGNOSIS — Z1159 Encounter for screening for other viral diseases: Secondary | ICD-10-CM | POA: Diagnosis not present

## 2020-11-09 DIAGNOSIS — Z20828 Contact with and (suspected) exposure to other viral communicable diseases: Secondary | ICD-10-CM | POA: Diagnosis not present

## 2020-11-09 DIAGNOSIS — Z1159 Encounter for screening for other viral diseases: Secondary | ICD-10-CM | POA: Diagnosis not present

## 2020-11-13 DIAGNOSIS — Z20828 Contact with and (suspected) exposure to other viral communicable diseases: Secondary | ICD-10-CM | POA: Diagnosis not present

## 2020-11-13 DIAGNOSIS — Z1159 Encounter for screening for other viral diseases: Secondary | ICD-10-CM | POA: Diagnosis not present

## 2020-11-20 DIAGNOSIS — Z1159 Encounter for screening for other viral diseases: Secondary | ICD-10-CM | POA: Diagnosis not present

## 2020-11-20 DIAGNOSIS — Z20828 Contact with and (suspected) exposure to other viral communicable diseases: Secondary | ICD-10-CM | POA: Diagnosis not present

## 2020-11-23 DIAGNOSIS — Z20828 Contact with and (suspected) exposure to other viral communicable diseases: Secondary | ICD-10-CM | POA: Diagnosis not present

## 2020-11-23 DIAGNOSIS — Z1159 Encounter for screening for other viral diseases: Secondary | ICD-10-CM | POA: Diagnosis not present

## 2020-11-27 DIAGNOSIS — Z1159 Encounter for screening for other viral diseases: Secondary | ICD-10-CM | POA: Diagnosis not present

## 2020-11-27 DIAGNOSIS — Z20828 Contact with and (suspected) exposure to other viral communicable diseases: Secondary | ICD-10-CM | POA: Diagnosis not present

## 2020-11-28 ENCOUNTER — Ambulatory Visit (INDEPENDENT_AMBULATORY_CARE_PROVIDER_SITE_OTHER): Payer: Medicare PPO | Admitting: Internal Medicine

## 2020-11-28 ENCOUNTER — Other Ambulatory Visit: Payer: Self-pay

## 2020-11-28 ENCOUNTER — Encounter: Payer: Self-pay | Admitting: Internal Medicine

## 2020-11-28 VITALS — BP 128/76 | HR 40 | Temp 98.2°F | Ht 65.0 in | Wt 161.2 lb

## 2020-11-28 DIAGNOSIS — N182 Chronic kidney disease, stage 2 (mild): Secondary | ICD-10-CM | POA: Diagnosis not present

## 2020-11-28 DIAGNOSIS — Z23 Encounter for immunization: Secondary | ICD-10-CM

## 2020-11-28 DIAGNOSIS — I129 Hypertensive chronic kidney disease with stage 1 through stage 4 chronic kidney disease, or unspecified chronic kidney disease: Secondary | ICD-10-CM

## 2020-11-28 DIAGNOSIS — L602 Onychogryphosis: Secondary | ICD-10-CM

## 2020-11-28 DIAGNOSIS — Z6826 Body mass index (BMI) 26.0-26.9, adult: Secondary | ICD-10-CM

## 2020-11-28 DIAGNOSIS — E663 Overweight: Secondary | ICD-10-CM

## 2020-11-28 MED ORDER — AMLODIPINE BESYLATE 2.5 MG PO TABS: 2.5000 mg | ORAL_TABLET | Freq: Every day | ORAL | 1 refills | Status: DC

## 2020-11-28 MED ORDER — VALSARTAN-HYDROCHLOROTHIAZIDE 160-12.5 MG PO TABS: 1.0000 | ORAL_TABLET | Freq: Every day | ORAL | 2 refills | Status: AC

## 2020-11-28 NOTE — Patient Instructions (Signed)

## 2020-11-28 NOTE — Progress Notes (Signed)
I,Tianna Badgett,acting as a Education administrator for [name], MD.,have documented all relevant documentation on the behalf of [name], MD,as directed by  [name], MD while in the presence of [name], MD.  This visit occurred during the SARS-CoV-2 public health emergency.  Safety protocols were in place, including screening questions prior to the visit, additional usage of staff PPE, and extensive cleaning of exam room while observing appropriate contact time as indicated for disinfecting solutions.  Subjective:     Patient ID: Allison Mills , female    DOB: Jan 01, 1916 , 86 y.o.   MRN: [id number]   Chief Complaint  Patient presents with  . Hypertension    HPI  She is here today for bp check.  She is accompanied by her daughter today. She reports compliance with medications. She is currently in independent living. Patient denies having any complaints of headaches, chest pain and shortness of breath.   Hypertension This is a chronic problem. The current episode started more than 1 year ago. The problem has been gradually improving since onset. The problem is controlled. Associated symptoms include peripheral edema. Pertinent negatives include no blurred vision or neck pain. Risk factors for coronary artery disease include post-menopausal state. Past treatments include calcium channel blockers. Hypertensive end-organ damage includes kidney disease. Identifiable causes of hypertension include chronic renal disease.     Past Medical History:  Diagnosis Date  . Coronary artery disease   . Hypertension   . Murmur   . RBBB (right bundle branch block)      Family History  Problem Relation Age of Onset  . Uterine cancer Mother   . Healthy Father   . Heart Problems Other      Current Outpatient Medications:  .  amLODipine (NORVASC) 2.5 MG tablet, Take 1 tablet (2.5 mg total) by mouth daily., Disp: 90 tablet, Rfl: 1 .  bimatoprost (LUMIGAN) 0.01 % SOLN, Place 1 drop  into both eyes at bedtime., Disp: 7.5 mL, Rfl: 1 .  brimonidine (ALPHAGAN P) 0.1 % SOLN, Place 1 drop into both eyes 3 (three) times daily., Disp: , Rfl:  .  brinzolamide (AZOPT) 1 % ophthalmic suspension, Place 1 drop into both eyes 3 (three) times daily., Disp: , Rfl:  .  Cholecalciferol (VITAMIN D3) 50 MCG (2000 UT) capsule, Take 2,000 Units by mouth daily., Disp: , Rfl:  .  Multiple Vitamins-Minerals (SENIOR MULTIVITAMIN PLUS PO), Take by mouth., Disp: , Rfl:  .  valsartan-hydrochlorothiazide (DIOVAN-HCT) 160-12.5 MG tablet, Take 1 tablet by mouth daily., Disp: 90 tablet, Rfl: 2 .  Magnesium 250 MG TABS, Take 1 tablet (250 mg total) by mouth daily. (Patient not taking: Reported on 11/28/2020), Disp: 90 tablet, Rfl: 0   No Known Allergies   Review of Systems  Constitutional: Negative.   Eyes: Negative for blurred vision.  Respiratory: Negative.   Cardiovascular: Negative.   Gastrointestinal: Negative.   Musculoskeletal: Negative for neck pain.  Neurological: Negative.   Psychiatric/Behavioral: Negative.   All other systems reviewed and are negative.    Today's Vitals   11/28/20 1131  BP: 128/76  Pulse: (!) 40  Temp: 98.2 F (36.8 C)  TempSrc: Oral  Weight: 161 lb 3.2 oz (73.1 kg)  Height: 5' 5"  (1.651 m)  PainSc: 0-No pain   Body mass index is 26.83 kg/m.  Wt Readings from Last 3 Encounters:  11/28/20 161 lb 3.2 oz (73.1 kg)  05/31/20 161 lb (73 kg)  05/31/20 161 lb (73 kg)  Objective:  Physical Exam Vitals and nursing note reviewed.  Constitutional:      Appearance: Normal appearance.  HENT:     Head: Normocephalic and atraumatic.  Cardiovascular:     Rate and Rhythm: Normal rate and regular rhythm.     Heart sounds: Normal heart sounds.  Pulmonary:     Breath sounds: Normal breath sounds.  Skin:    General: Skin is warm.  Neurological:     General: No focal deficit present.     Mental Status: She is alert and oriented to person, place, and time.          Assessment And Plan:     1. Hypertensive nephropathy Comments: Chronic, well controlled. She will continue with current meds. I will check renal function today.  She will /fu in six months for re-evaluation.  - amLODipine (NORVASC) 2.5 MG tablet; Take 1 tablet (2.5 mg total) by mouth daily.  Dispense: 90 tablet; Refill: 1 - valsartan-hydrochlorothiazide (DIOVAN-HCT) 160-12.5 MG tablet; Take 1 tablet by mouth daily.  Dispense: 90 tablet; Refill: 2 - CMP14+EGFR - Lipid panel  2. Chronic renal disease, stage II Comments: Chronic, I will check GFR, Cr today. She is encouraged to stay well hydrated.  - amLODipine (NORVASC) 2.5 MG tablet; Take 1 tablet (2.5 mg total) by mouth daily.  Dispense: 90 tablet; Refill: 1 - valsartan-hydrochlorothiazide (DIOVAN-HCT) 160-12.5 MG tablet; Take 1 tablet by mouth daily.  Dispense: 90 tablet; Refill: 2  3. Thickened nails Comments: Pt's daughter will check to see if there is in-house podiatrist at [facility]. IF there is not one available, I will refer her to Dr. Melony Overly who has mobile practice.  - Ambulatory referral to Podiatry  4. Overweight with body mass index (BMI) of 26 to 26.9 in adult Comments: Her BMI is stable for her demographic. She is encouraged to continue to walk daily in her facility.   5. Immunization due Comments: I will check Registry - it appears Martha Clan is due. If so, I will send to her local pharmacy.    Patient was given opportunity to ask questions. Patient verbalized understanding of the plan and was able to repeat key elements of the plan. All questions were answered to their satisfaction.  [name], MD   I, [name], MD, have reviewed all documentation for this visit. The documentation on 12/30/20 for the exam, diagnosis, procedures, and orders are all accurate and complete.  THE PATIENT IS ENCOURAGED TO PRACTICE SOCIAL DISTANCING DUE TO THE COVID-19 PANDEMIC.

## 2020-11-29 LAB — CMP14+EGFR
ALT: 16 IU/L (ref 0–32)
AST: 16 IU/L (ref 0–40)
Albumin/Globulin Ratio: 1.5 (ref 1.2–2.2)
Albumin: 4 g/dL (ref 3.5–4.6)
Alkaline Phosphatase: 74 IU/L (ref 44–121)
BUN/Creatinine Ratio: 26 (ref 12–28)
BUN: 17 mg/dL (ref 10–36)
Bilirubin Total: 0.5 mg/dL (ref 0.0–1.2)
CO2: 26 mmol/L (ref 20–29)
Calcium: 9.9 mg/dL (ref 8.7–10.3)
Chloride: 100 mmol/L (ref 96–106)
Creatinine, Ser: 0.65 mg/dL (ref 0.57–1.00)
GFR calc Af Amer: 83 mL/min/{1.73_m2} (ref >59)
GFR calc non Af Amer: 72 mL/min/{1.73_m2} (ref >59)
Globulin, Total: 2.6 g/dL (ref 1.5–4.5)
Glucose: 89 mg/dL (ref 65–99)
Potassium: 4.7 mmol/L (ref 3.5–5.2)
Sodium: 138 mmol/L (ref 134–144)
Total Protein: 6.6 g/dL (ref 6.0–8.5)

## 2020-11-29 LAB — LIPID PANEL
Chol/HDL Ratio: 2.2 ratio (ref 0.0–4.4)
Cholesterol, Total: 150 mg/dL (ref 100–199)
HDL: 68 mg/dL (ref >39)
LDL Chol Calc (NIH): 69 mg/dL (ref 0–99)
Triglycerides: 67 mg/dL (ref 0–149)
VLDL Cholesterol Cal: 13 mg/dL (ref 5–40)

## 2020-11-30 DIAGNOSIS — Z20828 Contact with and (suspected) exposure to other viral communicable diseases: Secondary | ICD-10-CM | POA: Diagnosis not present

## 2020-11-30 DIAGNOSIS — Z1159 Encounter for screening for other viral diseases: Secondary | ICD-10-CM | POA: Diagnosis not present

## 2020-12-04 DIAGNOSIS — Z20828 Contact with and (suspected) exposure to other viral communicable diseases: Secondary | ICD-10-CM | POA: Diagnosis not present

## 2020-12-04 DIAGNOSIS — Z1159 Encounter for screening for other viral diseases: Secondary | ICD-10-CM | POA: Diagnosis not present

## 2020-12-07 DIAGNOSIS — Z20828 Contact with and (suspected) exposure to other viral communicable diseases: Secondary | ICD-10-CM | POA: Diagnosis not present

## 2020-12-07 DIAGNOSIS — Z1159 Encounter for screening for other viral diseases: Secondary | ICD-10-CM | POA: Diagnosis not present

## 2020-12-11 DIAGNOSIS — Z20828 Contact with and (suspected) exposure to other viral communicable diseases: Secondary | ICD-10-CM | POA: Diagnosis not present

## 2020-12-11 DIAGNOSIS — Z1159 Encounter for screening for other viral diseases: Secondary | ICD-10-CM | POA: Diagnosis not present

## 2020-12-21 DIAGNOSIS — Z1159 Encounter for screening for other viral diseases: Secondary | ICD-10-CM | POA: Diagnosis not present

## 2020-12-21 DIAGNOSIS — Z20828 Contact with and (suspected) exposure to other viral communicable diseases: Secondary | ICD-10-CM | POA: Diagnosis not present

## 2020-12-25 DIAGNOSIS — Z20828 Contact with and (suspected) exposure to other viral communicable diseases: Secondary | ICD-10-CM | POA: Diagnosis not present

## 2020-12-25 DIAGNOSIS — Z1159 Encounter for screening for other viral diseases: Secondary | ICD-10-CM | POA: Diagnosis not present

## 2020-12-28 DIAGNOSIS — Z1159 Encounter for screening for other viral diseases: Secondary | ICD-10-CM | POA: Diagnosis not present

## 2020-12-28 DIAGNOSIS — Z20828 Contact with and (suspected) exposure to other viral communicable diseases: Secondary | ICD-10-CM | POA: Diagnosis not present

## 2021-01-10 DIAGNOSIS — Z20828 Contact with and (suspected) exposure to other viral communicable diseases: Secondary | ICD-10-CM | POA: Diagnosis not present

## 2021-01-17 DIAGNOSIS — Z20828 Contact with and (suspected) exposure to other viral communicable diseases: Secondary | ICD-10-CM | POA: Diagnosis not present

## 2021-01-21 DIAGNOSIS — Z20828 Contact with and (suspected) exposure to other viral communicable diseases: Secondary | ICD-10-CM | POA: Diagnosis not present

## 2021-01-24 DIAGNOSIS — Z20828 Contact with and (suspected) exposure to other viral communicable diseases: Secondary | ICD-10-CM | POA: Diagnosis not present

## 2021-01-28 DIAGNOSIS — Z20828 Contact with and (suspected) exposure to other viral communicable diseases: Secondary | ICD-10-CM | POA: Diagnosis not present

## 2021-01-31 DIAGNOSIS — Z20828 Contact with and (suspected) exposure to other viral communicable diseases: Secondary | ICD-10-CM | POA: Diagnosis not present

## 2021-02-04 DIAGNOSIS — Z20828 Contact with and (suspected) exposure to other viral communicable diseases: Secondary | ICD-10-CM | POA: Diagnosis not present

## 2021-02-07 DIAGNOSIS — Z20828 Contact with and (suspected) exposure to other viral communicable diseases: Secondary | ICD-10-CM | POA: Diagnosis not present

## 2021-02-11 DIAGNOSIS — Z20828 Contact with and (suspected) exposure to other viral communicable diseases: Secondary | ICD-10-CM | POA: Diagnosis not present

## 2021-02-14 DIAGNOSIS — Z20828 Contact with and (suspected) exposure to other viral communicable diseases: Secondary | ICD-10-CM | POA: Diagnosis not present

## 2021-02-18 DIAGNOSIS — Z20828 Contact with and (suspected) exposure to other viral communicable diseases: Secondary | ICD-10-CM | POA: Diagnosis not present

## 2021-02-25 DIAGNOSIS — Z20828 Contact with and (suspected) exposure to other viral communicable diseases: Secondary | ICD-10-CM | POA: Diagnosis not present

## 2021-03-04 DIAGNOSIS — Z20828 Contact with and (suspected) exposure to other viral communicable diseases: Secondary | ICD-10-CM | POA: Diagnosis not present

## 2021-03-11 DIAGNOSIS — Z20828 Contact with and (suspected) exposure to other viral communicable diseases: Secondary | ICD-10-CM | POA: Diagnosis not present

## 2021-03-13 DIAGNOSIS — H401133 Primary open-angle glaucoma, bilateral, severe stage: Secondary | ICD-10-CM | POA: Diagnosis not present

## 2021-03-13 DIAGNOSIS — H16223 Keratoconjunctivitis sicca, not specified as Sjogren's, bilateral: Secondary | ICD-10-CM | POA: Diagnosis not present

## 2021-03-13 DIAGNOSIS — H2512 Age-related nuclear cataract, left eye: Secondary | ICD-10-CM | POA: Diagnosis not present

## 2021-03-18 DIAGNOSIS — Z20828 Contact with and (suspected) exposure to other viral communicable diseases: Secondary | ICD-10-CM | POA: Diagnosis not present

## 2021-03-25 DIAGNOSIS — Z20828 Contact with and (suspected) exposure to other viral communicable diseases: Secondary | ICD-10-CM | POA: Diagnosis not present

## 2021-04-01 DIAGNOSIS — Z20828 Contact with and (suspected) exposure to other viral communicable diseases: Secondary | ICD-10-CM | POA: Diagnosis not present

## 2021-04-08 DIAGNOSIS — Z20828 Contact with and (suspected) exposure to other viral communicable diseases: Secondary | ICD-10-CM | POA: Diagnosis not present

## 2021-04-15 DIAGNOSIS — Z20828 Contact with and (suspected) exposure to other viral communicable diseases: Secondary | ICD-10-CM | POA: Diagnosis not present

## 2021-04-22 DIAGNOSIS — Z20828 Contact with and (suspected) exposure to other viral communicable diseases: Secondary | ICD-10-CM | POA: Diagnosis not present

## 2021-04-29 DIAGNOSIS — Z20828 Contact with and (suspected) exposure to other viral communicable diseases: Secondary | ICD-10-CM | POA: Diagnosis not present

## 2021-05-06 DIAGNOSIS — Z20828 Contact with and (suspected) exposure to other viral communicable diseases: Secondary | ICD-10-CM | POA: Diagnosis not present

## 2021-05-07 DIAGNOSIS — B351 Tinea unguium: Secondary | ICD-10-CM | POA: Diagnosis not present

## 2021-05-07 DIAGNOSIS — R601 Generalized edema: Secondary | ICD-10-CM | POA: Diagnosis not present

## 2021-05-07 DIAGNOSIS — M2041 Other hammer toe(s) (acquired), right foot: Secondary | ICD-10-CM | POA: Diagnosis not present

## 2021-05-07 DIAGNOSIS — L84 Corns and callosities: Secondary | ICD-10-CM | POA: Diagnosis not present

## 2021-05-13 DIAGNOSIS — Z20828 Contact with and (suspected) exposure to other viral communicable diseases: Secondary | ICD-10-CM | POA: Diagnosis not present

## 2021-05-20 DIAGNOSIS — Z20828 Contact with and (suspected) exposure to other viral communicable diseases: Secondary | ICD-10-CM | POA: Diagnosis not present

## 2021-05-24 ENCOUNTER — Other Ambulatory Visit: Payer: Self-pay | Admitting: Internal Medicine

## 2021-05-24 MED ORDER — CEPHALEXIN 500 MG PO CAPS: 500.0000 mg | ORAL_CAPSULE | Freq: Two times a day (BID) | ORAL | 0 refills | Status: AC

## 2021-05-27 ENCOUNTER — Other Ambulatory Visit: Payer: Self-pay

## 2021-05-27 ENCOUNTER — Telehealth: Payer: Self-pay | Admitting: *Deleted

## 2021-05-27 ENCOUNTER — Ambulatory Visit (INDEPENDENT_AMBULATORY_CARE_PROVIDER_SITE_OTHER): Payer: Medicare PPO | Admitting: Internal Medicine

## 2021-05-27 ENCOUNTER — Encounter: Payer: Self-pay | Admitting: Internal Medicine

## 2021-05-27 VITALS — BP 122/80 | HR 52 | Temp 98.2°F | Ht 65.0 in | Wt 150.0 lb

## 2021-05-27 DIAGNOSIS — I129 Hypertensive chronic kidney disease with stage 1 through stage 4 chronic kidney disease, or unspecified chronic kidney disease: Secondary | ICD-10-CM | POA: Diagnosis not present

## 2021-05-27 DIAGNOSIS — N182 Chronic kidney disease, stage 2 (mild): Secondary | ICD-10-CM

## 2021-05-27 DIAGNOSIS — N3000 Acute cystitis without hematuria: Secondary | ICD-10-CM | POA: Diagnosis not present

## 2021-05-27 DIAGNOSIS — Z20828 Contact with and (suspected) exposure to other viral communicable diseases: Secondary | ICD-10-CM | POA: Diagnosis not present

## 2021-05-27 LAB — POCT URINALYSIS DIPSTICK
Bilirubin, UA: NEGATIVE
Blood, UA: NEGATIVE
Glucose, UA: NEGATIVE
Ketones, UA: NEGATIVE
Leukocytes, UA: NEGATIVE
Nitrite, UA: NEGATIVE
Protein, UA: NEGATIVE
Spec Grav, UA: 1.02 (ref 1.010–1.025)
Urobilinogen, UA: 0.2 E.U./dL
pH, UA: 7 (ref 5.0–8.0)

## 2021-05-27 NOTE — Chronic Care Management (AMB) (Signed)
  Chronic Care Management   Outreach Note  05/27/2021 Name: PIERRE CUMPTON MRN: [id number] DOB: 1916/01/05  RYDER CHESMORE is a 87 y.o. year old female who is a primary care patient of Dorothyann Peng, MD. I reached out to Aurora Mask by phone today in response to a referral sent by Ms. Keith Rake Carachure's PCP, Dr. Allyne Gee.     An unsuccessful telephone outreach was attempted today. The patient was referred to the case management team for assistance with care management and care coordination.   Follow Up Plan: A HIPAA compliant phone message was left for the patient providing contact information and requesting a return call. The care management team will reach out to the patient again over the next 2 days. If patient returns call to provider office, please advise to call Embedded Care Management Care Guide Gwenevere Ghazi at [phone].  Gwenevere Ghazi  Care Guide, Embedded Care Coordination [facility] Management

## 2021-05-27 NOTE — Patient Instructions (Signed)
Urinary Tract Infection, Adult  A urinary tract infection (UTI) is an infection of any part of the urinary tract. The urinary tract includes the kidneys, ureters, bladder, and urethra. These organs make, store, and get rid of urine in the body. An upper UTI affects the ureters and kidneys. A lower UTI affects the bladder and urethra. What are the causes? Most urinary tract infections are caused by bacteria in your genital area around your urethra, where urine leaves your body. These bacteria grow and cause inflammation of your urinary tract. What increases the risk? You are more likely to develop this condition if:  You have a urinary catheter that stays in place.  You are not able to control when you urinate or have a bowel movement (incontinence).  You are female and you: ? Use a spermicide or diaphragm for birth control. ? Have low estrogen levels. ? Are pregnant.  You have certain genes that increase your risk.  You are sexually active.  You take antibiotic medicines.  You have a condition that causes your flow of urine to slow down, such as: ? An enlarged prostate, if you are female. ? Blockage in your urethra. ? A kidney stone. ? A nerve condition that affects your bladder control (neurogenic bladder). ? Not getting enough to drink, or not urinating often.  You have certain medical conditions, such as: ? Diabetes. ? A weak disease-fighting system (immunesystem). ? Sickle cell disease. ? Gout. ? Spinal cord injury. What are the signs or symptoms? Symptoms of this condition include:  Needing to urinate right away (urgency).  Frequent urination. This may include small amounts of urine each time you urinate.  Pain or burning with urination.  Blood in the urine.  Urine that smells bad or unusual.  Trouble urinating.  Cloudy urine.  Vaginal discharge, if you are female.  Pain in the abdomen or the lower back. You may also have:  Vomiting or a decreased  appetite.  Confusion.  Irritability or tiredness.  A fever or chills.  Diarrhea. The first symptom in older adults may be confusion. In some cases, they may not have any symptoms until the infection has worsened. How is this diagnosed? This condition is diagnosed based on your medical history and a physical exam. You may also have other tests, including:  Urine tests.  Blood tests.  Tests for STIs (sexually transmitted infections). If you have had more than one UTI, a cystoscopy or imaging studies may be done to determine the cause of the infections. How is this treated? Treatment for this condition includes:  Antibiotic medicine.  Over-the-counter medicines to treat discomfort.  Drinking enough water to stay hydrated. If you have frequent infections or have other conditions such as a kidney stone, you may need to see a health care provider who specializes in the urinary tract (urologist). In rare cases, urinary tract infections can cause sepsis. Sepsis is a life-threatening condition that occurs when the body responds to an infection. Sepsis is treated in the hospital with IV antibiotics, fluids, and other medicines. Follow these instructions at home: Medicines  Take over-the-counter and prescription medicines only as told by your health care provider.  If you were prescribed an antibiotic medicine, take it as told by your health care provider. Do not stop using the antibiotic even if you start to feel better. General instructions  Make sure you: ? Empty your bladder often and completely. Do not hold urine for long periods of time. ? Empty your bladder after   sex. ? Wipe from front to back after urinating or having a bowel movement if you are female. Use each tissue only one time when you wipe.  Drink enough fluid to keep your urine pale yellow.  Keep all follow-up visits. This is important.   Contact a health care provider if:  Your symptoms do not get better after 1-2  days.  Your symptoms go away and then return. Get help right away if:  You have severe pain in your back or your lower abdomen.  You have a fever or chills.  You have nausea or vomiting. Summary  A urinary tract infection (UTI) is an infection of any part of the urinary tract, which includes the kidneys, ureters, bladder, and urethra.  Most urinary tract infections are caused by bacteria in your genital area.  Treatment for this condition often includes antibiotic medicines.  If you were prescribed an antibiotic medicine, take it as told by your health care provider. Do not stop using the antibiotic even if you start to feel better.  Keep all follow-up visits. This is important. This information is not intended to replace advice given to you by your health care provider. Make sure you discuss any questions you have with your health care provider. Document Revised: 07/20/2020 Document Reviewed: 07/20/2020 Elsevier Patient Education  2021 Elsevier Inc.  

## 2021-05-27 NOTE — Progress Notes (Signed)
I,Allison Mills,acting as a Education administrator for Allison Greenland, MD.,have documented all relevant documentation on the behalf of Allison Greenland, MD,as directed by  Allison Greenland, MD while in the presence of Allison Greenland, MD.  This visit occurred during the SARS-CoV-2 public health emergency.  Safety protocols were in place, including screening questions prior to the visit, additional usage of staff PPE, and extensive cleaning of exam room while observing appropriate contact time as indicated for disinfecting solutions.  Subjective:     Patient ID: Allison Mills , female    DOB: 1916/06/27 , 87 y.o.   MRN: [id number]   Chief Complaint  Patient presents with   Urinary Tract Infection    HPI  The patient is here for evaluation of UTI.  She is brought in by her daughter today. States her aid commented last week that urine smell was strong and she was having frequency. Daughter called office Friday and abx was sent to the pharmacy. She has tolerated cephalexin without any issues.   Urinary Tract Infection  This is a recurrent problem. The problem occurs every urination. The problem has been gradually improving. Associated symptoms include frequency. The treatment provided moderate relief.    Past Medical History:  Diagnosis Date   Coronary artery disease    Hypertension    Murmur    RBBB (right bundle branch block)      Family History  Problem Relation Age of Onset   Uterine cancer Mother    Healthy Father    Heart Problems Other      Current Outpatient Medications:    amLODipine (NORVASC) 2.5 MG tablet, Take 1 tablet (2.5 mg total) by mouth daily., Disp: 90 tablet, Rfl: 1   bimatoprost (LUMIGAN) 0.01 % SOLN, Place 1 drop into both eyes at bedtime., Disp: 7.5 mL, Rfl: 1   brimonidine (ALPHAGAN P) 0.1 % SOLN, Place 1 drop into both eyes 3 (three) times daily., Disp: , Rfl:    brinzolamide (AZOPT) 1 % ophthalmic suspension, Place 1 drop into both eyes 3 (three) times daily.,  Disp: , Rfl:    Cholecalciferol (VITAMIN D3) 50 MCG (2000 UT) capsule, Take 2,000 Units by mouth daily., Disp: , Rfl:    Magnesium 250 MG TABS, Take 1 tablet (250 mg total) by mouth daily. (Patient not taking: Reported on 11/28/2020), Disp: 90 tablet, Rfl: 0   Multiple Vitamins-Minerals (SENIOR MULTIVITAMIN PLUS PO), Take by mouth., Disp: , Rfl:    valsartan-hydrochlorothiazide (DIOVAN-HCT) 160-12.5 MG tablet, Take 1 tablet by mouth daily., Disp: 90 tablet, Rfl: 2   No Known Allergies   Review of Systems  Constitutional: Negative.   Respiratory: Negative.    Cardiovascular: Negative.   Gastrointestinal: Negative.   Genitourinary:  Positive for dysuria and frequency.  Neurological: Negative.   Psychiatric/Behavioral: Negative.      Today's Vitals   05/27/21 1207  BP: 122/80  Pulse: (!) 52  Temp: 98.2 F (36.8 C)  TempSrc: Oral  Weight: 150 lb (68 kg)  Height: 5' 5"  (1.651 m)  PainSc: 0-No pain   Body mass index is 24.96 kg/m.  Wt Readings from Last 3 Encounters:  05/27/21 150 lb (68 kg)  11/28/20 161 lb 3.2 oz (73.1 kg)  05/31/20 161 lb (73 kg)   BP Readings from Last 3 Encounters:  05/27/21 122/80  11/28/20 128/76  05/31/20 138/72   Objective:  Physical Exam Vitals and nursing note reviewed.  Constitutional:      Appearance: Normal appearance.  HENT:  Head: Normocephalic and atraumatic.     Nose:     Comments: Masked     Mouth/Throat:     Comments: Masked  Cardiovascular:     Rate and Rhythm: Normal rate and regular rhythm.     Heart sounds: Normal heart sounds.  Pulmonary:     Effort: Pulmonary effort is normal.     Breath sounds: Normal breath sounds.  Skin:    General: Skin is warm.  Neurological:     General: No focal deficit present.     Mental Status: She is alert.  Psychiatric:        Mood and Affect: Mood normal.        Behavior: Behavior normal.        Assessment And Plan:     1. Acute cystitis without hematuria Comments: She reports  compliance with abx called in on Friday. She is encouraged to complete full course.  - POCT Urinalysis Dipstick (81002) - CBC no Diff  2. Hypertensive nephropathy Comments: Chronic, well controlled. She is encouraged to follow low sodium diet. Pt currently resides in [place]. Daughter states she thinks it is time for her to be moved to Assisted Living facility. She agrees to CCM referral in hopes that CCM team can help with this process.  - [facility]B [facility] to [facility] Coordinaton - CMP14+EGFR - TSH  3. Chronic renal disease, stage II Comments: Chronic, I will check renal function today.  - AMB Referral to [place]    Patient was given opportunity to ask questions. Patient verbalized understanding of the plan and was able to repeat key elements of the plan. All questions were answered to their satisfaction.   I, Allison Greenland, MD, have reviewed all documentation for this visit. The documentation on 05/27/21 for the exam, diagnosis, procedures, and orders are all accurate and complete.   IF YOU HAVE BEEN REFERRED TO A SPECIALIST, IT MAY TAKE 1-2 WEEKS TO SCHEDULE/PROCESS THE REFERRAL. IF YOU HAVE NOT HEARD FROM US/SPECIALIST IN TWO WEEKS, PLEASE GIVE [place] A CALL AT [phone].   THE PATIENT IS ENCOURAGED TO PRACTICE SOCIAL DISTANCING DUE TO THE COVID-19 PANDEMIC.

## 2021-05-28 LAB — CMP14+EGFR
ALT: 13 IU/L (ref 0–32)
AST: 20 IU/L (ref 0–40)
Albumin/Globulin Ratio: 1.4 (ref 1.2–2.2)
Albumin: 4 g/dL (ref 3.5–4.6)
Alkaline Phosphatase: 65 IU/L (ref 44–121)
BUN/Creatinine Ratio: 21 (ref 12–28)
BUN: 17 mg/dL (ref 10–36)
Bilirubin Total: 0.4 mg/dL (ref 0.0–1.2)
CO2: 25 mmol/L (ref 20–29)
Calcium: 10.1 mg/dL (ref 8.7–10.3)
Chloride: 97 mmol/L (ref 96–106)
Creatinine, Ser: 0.81 mg/dL (ref 0.57–1.00)
Globulin, Total: 2.8 g/dL (ref 1.5–4.5)
Glucose: 86 mg/dL (ref 65–99)
Potassium: 4.3 mmol/L (ref 3.5–5.2)
Sodium: 136 mmol/L (ref 134–144)
Total Protein: 6.8 g/dL (ref 6.0–8.5)
eGFR: 64 mL/min/{1.73_m2} (ref >59)

## 2021-05-28 LAB — CBC
Hematocrit: 41.8 % (ref 34.0–46.6)
Hemoglobin: 13.7 g/dL (ref 11.1–15.9)
MCH: 30.6 pg (ref 26.6–33.0)
MCHC: 32.8 g/dL (ref 31.5–35.7)
MCV: 93 fL (ref 79–97)
Platelets: 166 10*3/uL (ref 150–450)
RBC: 4.48 x10E6/uL (ref 3.77–5.28)
RDW: 13.1 % (ref 11.7–15.4)
WBC: 3.9 10*3/uL (ref 3.4–10.8)

## 2021-05-28 LAB — TSH: TSH: 2.68 u[IU]/mL (ref 0.450–4.500)

## 2021-05-28 NOTE — Chronic Care Management (AMB) (Signed)
  Chronic Care Management   Outreach Note  05/28/2021 Name: MERCI WALTHERS MRN: [id number] DOB: 07-20-1916  CHEA MALAN is a 87 y.o. year old female who is a primary care patient of Dorothyann Peng, MD. I reached out to Aurora Mask by phone today in response to a referral sent by Ms. Keith Rake Fasnacht's PCP, Dr. Allyne Gee.      A second unsuccessful telephone outreach was attempted today. The patient was referred to the case management team for assistance with care management and care coordination.   Follow Up Plan: A HIPAA compliant phone message was left for the patient providing contact information and requesting a return call.  The care management team will reach out to the patient again over the next 7 days. If patient returns call to provider office, please advise to call Embedded Care Management Care Guide Gwenevere Ghazi at [phone].  Gwenevere Ghazi  Care Guide, Embedded Care Coordination [facility] Management

## 2021-05-28 NOTE — Chronic Care Management (AMB) (Signed)
  Chronic Care Management   Note  05/28/2021 Name: TIM WILHIDE MRN: [id number] DOB: 09/06/1916  Allison Mills is a 87 y.o. year old female who is a primary care patient of Glendale Chard, MD. I reached out to Larina Earthly by phone today in response to a referral sent by Ms. Alberteen Sam Franchi's PCP, Dr. Baird Cancer.     [name] was given information about Chronic Care Management services today including:  1. CCM service includes personalized support from designated clinical staff supervised by her physician, including individualized plan of care and coordination with other care providers 2. 24/7 contact phone numbers for assistance for urgent and routine care needs. 3. Service will only be billed when office clinical staff spend 20 minutes or more in a month to coordinate care. 4. Only one practitioner may furnish and bill the service in a calendar month. 5. The patient may stop CCM services at any time (effective at the end of the month) by phone call to the office staff. 6. The patient will be responsible for cost sharing (co-pay) of up to 20% of the service fee (after annual deductible is met).  Daughter [name]  verbally agreed to assistance and services provided by embedded care coordination/care management team today.  Follow up plan: Telephone appointment with care management team member scheduled for:06/05/2021  [facility]

## 2021-05-30 DIAGNOSIS — Z20828 Contact with and (suspected) exposure to other viral communicable diseases: Secondary | ICD-10-CM | POA: Diagnosis not present

## 2021-06-03 DIAGNOSIS — Z20828 Contact with and (suspected) exposure to other viral communicable diseases: Secondary | ICD-10-CM | POA: Diagnosis not present

## 2021-06-05 ENCOUNTER — Ambulatory Visit (INDEPENDENT_AMBULATORY_CARE_PROVIDER_SITE_OTHER): Payer: Medicare PPO

## 2021-06-05 ENCOUNTER — Telehealth: Payer: Medicare PPO

## 2021-06-05 ENCOUNTER — Ambulatory Visit: Payer: Self-pay

## 2021-06-05 ENCOUNTER — Ambulatory Visit: Payer: Medicare PPO | Admitting: Internal Medicine

## 2021-06-05 DIAGNOSIS — I129 Hypertensive chronic kidney disease with stage 1 through stage 4 chronic kidney disease, or unspecified chronic kidney disease: Secondary | ICD-10-CM | POA: Diagnosis not present

## 2021-06-05 DIAGNOSIS — N182 Chronic kidney disease, stage 2 (mild): Secondary | ICD-10-CM

## 2021-06-05 NOTE — Patient Instructions (Signed)
Social Worker Visit Information  Goals we discussed today:   Goals Addressed             This Visit's Progress    Effective Long-term Care Planning       Timeframe:  Long-Range Goal Priority:  Medium Start Date: 6.15.22                            Expected End Date:  10.13.22                     Next planned outreach: 7.8.22  Patient Goals/Self-Care Activities patient will: with the help of [name]  - Tour local assisted living facilities to determine options for placement -Engage with [place] to develop an individualized plan of care -Contact SW as needed prior to next scheduled call          Materials provided: Verbal education about Assisted Living Communities provided by phone  [name] was given information about Chronic Care Management services today including:  CCM service includes personalized support from designated clinical staff supervised by her physician, including individualized plan of care and coordination with other care providers 24/7 contact phone numbers for assistance for urgent and routine care needs. Service will only be billed when office clinical staff spend 20 minutes or more in a month to coordinate care. Only one practitioner may furnish and bill the service in a calendar month. The patient may stop CCM services at any time (effective at the end of the month) by phone call to the office staff. The patient will be responsible for cost sharing (co-pay) of up to 20% of the service fee (after annual deductible is met).  Patient agreed to services and verbal consent obtained.   The patient verbalized understanding of instructions, educational materials, and care plan provided today and declined offer to receive copy of patient instructions, educational materials, and care plan.   Follow up plan: SW will follow up with patient by phone over the next month   Daneen Schick, BSW, CDP Social Worker, Certified Dementia  Practitioner [place] / [place] [facility] [phone]

## 2021-06-05 NOTE — Chronic Care Management (AMB) (Signed)
Chronic Care Management   CCM RN Visit Note  06/05/2021 Name: Allison Mills MRN: [id number] DOB: [birth year]  Subjective: Allison Mills is a 87 y.o. year old female who is a primary care patient of Glendale Chard, MD. The care management team was consulted for assistance with disease management and care coordination needs.    Collaboration with Daneen Schick BSW  for  Case Collaboration   in response to provider referral for case management and/or care coordination services.   Consent to Services:  The patient was given the following information about Chronic Care Management services today, agreed to services, and gave verbal consent: 1. CCM service includes personalized support from designated clinical staff supervised by the primary care provider, including individualized plan of care and coordination with other care providers 2. 24/7 contact phone numbers for assistance for urgent and routine care needs. 3. Service will only be billed when office clinical staff spend 20 minutes or more in a month to coordinate care. 4. Only one practitioner may furnish and bill the service in a calendar month. 5.The patient may stop CCM services at any time (effective at the end of the month) by phone call to the office staff. 6. The patient will be responsible for cost sharing (co-pay) of up to 20% of the service fee (after annual deductible is met). Patient agreed to services and consent obtained.  Patient agreed to services and verbal consent obtained.   Assessment: Review of patient past medical history, allergies, medications, health status, including review of consultants reports, laboratory and other test data, was performed as part of comprehensive evaluation and provision of chronic care management services.   SDOH (Social Determinants of Health) assessments and interventions performed:  Yes, see care plan   CCM Care Plan  No Known Allergies  Outpatient Encounter Medications as of 06/05/2021   Medication Sig   amLODipine (NORVASC) 2.5 MG tablet Take 1 tablet (2.5 mg total) by mouth daily.   bimatoprost (LUMIGAN) 0.01 % SOLN Place 1 drop into both eyes at bedtime.   brimonidine (ALPHAGAN P) 0.1 % SOLN Place 1 drop into both eyes 3 (three) times daily.   brinzolamide (AZOPT) 1 % ophthalmic suspension Place 1 drop into both eyes 3 (three) times daily.   Cholecalciferol (VITAMIN D3) 50 MCG (2000 UT) capsule Take 2,000 Units by mouth daily.   Magnesium 250 MG TABS Take 1 tablet (250 mg total) by mouth daily. (Patient not taking: Reported on 11/28/2020)   Multiple Vitamins-Minerals (SENIOR MULTIVITAMIN PLUS PO) Take by mouth.   valsartan-hydrochlorothiazide (DIOVAN-HCT) 160-12.5 MG tablet Take 1 tablet by mouth daily.   No facility-administered encounter medications on file as of 06/05/2021.    Patient Active Problem List   Diagnosis Date Noted   Hypertensive nephropathy 04/02/2019   Chronic renal disease, stage II 04/02/2019   Chronic pain of left knee 04/02/2019   Bilateral leg edema 04/02/2019   Vitamin D deficiency    Bradycardia 08/22/2013    Conditions to be addressed/monitored: Hypertensive nephropathy; Chronic renal disease, stage II  Care Plan : Assist with Chronic Care Management and Care Coordination needs  Updates made by Lynne Logan, RN since 06/05/2021 12:00 AM     Problem: Assist with Chronic Care Management and Care Coordination needs   Priority: High     Goal: Assist with Chronic Care Management and Care Coordination needs   Start Date: 06/05/2021  Expected End Date: 08/06/2021  This Visit's Progress: On track  Priority: High  Note:  Current Barriers:  Chronic Disease Management support, education, chronic care management and care coordination needs related to Hypertensive nephropathy; Chronic renal disease, stage II with RNCM, SW and Pharmacy Care Management and Care coordination needs Case Manager Clinical Goal(s):  Patient will work with the CCM  team to address needs related to chronic care management and care coordination needs related to Hypertensive nephropathy; Chronic renal disease, stage II with RNCM, SW and Pharmacy Care Management and Care Coordination needs Interventions:  Collaborated with BSW to initiate plan of care to address needs related to chronic care management and care coordination needs related to Hypertensive nephropathy; Chronic renal disease, stage II with RNCM, SW and Pharmacy Care Management and Care Coordination needs Collaboration with Glendale Chard, MD regarding development and update of comprehensive plan of care as evidenced by provider attestation and co-signature Inter-disciplinary care team collaboration (see longitudinal plan of care) Patient Goals/Self-Care Activities patient will:   - Patient will work with the CCM team to address chronic care management and care coordination needs and will continue to work with the clinical team to address health care and disease management related needs.   Follow Up Plan: The care management team will reach out to the patient again over the next 30-60 days.      Plan:Telephone follow up appointment with care management team member scheduled for:  07/23/21  Barb Merino, RN, BSN, CCM Care Management Coordinator [place] [facility]  Direct Phone: [phone]

## 2021-06-05 NOTE — Chronic Care Management (AMB) (Signed)
Chronic Care Management    Social Work Note  06/05/2021 Name: Allison Mills MRN: [id number] DOB: 1916-08-12  Allison Mills is a 87 y.o. year old female who is a primary care patient of Allison Chard, MD. The CCM team was consulted to assist the patient with chronic disease management and/or care coordination needs related to: Level of Care Concerns.   Engaged with patients daughter Allison Mills by phone  for initial visit in response to provider referral for social work chronic care management and care coordination services.   Consent to Services:  The patient was given the following information about Chronic Care Management services today, agreed to services, and gave verbal consent: 1. CCM service includes personalized support from designated clinical staff supervised by the primary care provider, including individualized plan of care and coordination with other care providers 2. 24/7 contact phone numbers for assistance for urgent and routine care needs. 3. Service will only be billed when office clinical staff spend 20 minutes or more in a month to coordinate care. 4. Only one practitioner may furnish and bill the service in a calendar month. 5.The patient may stop CCM services at any time (effective at the end of the month) by phone call to the office staff. 6. The patient will be responsible for cost sharing (co-pay) of up to 20% of the service fee (after annual deductible is met). Patient agreed to services and consent obtained.  Patient agreed to services and consent obtained.   Assessment: Review of patient past medical history, allergies, medications, and health status, including review of relevant consultants reports was performed today as part of a comprehensive evaluation and provision of chronic care management and care coordination services.     SDOH (Social Determinants of Health) assessments and interventions performed:  SDOH Interventions    Flowsheet Row Most Recent Value   SDOH Interventions   Food Insecurity Interventions Intervention Not Indicated  Housing Interventions Intervention Not Indicated  Transportation Interventions Intervention Not Indicated        Advanced Directives Status: Not addressed in this encounter.  CCM Care Plan  No Known Allergies  Outpatient Encounter Medications as of 06/05/2021  Medication Sig   amLODipine (NORVASC) 2.5 MG tablet Take 1 tablet (2.5 mg total) by mouth daily.   bimatoprost (LUMIGAN) 0.01 % SOLN Place 1 drop into both eyes at bedtime.   brimonidine (ALPHAGAN P) 0.1 % SOLN Place 1 drop into both eyes 3 (three) times daily.   brinzolamide (AZOPT) 1 % ophthalmic suspension Place 1 drop into both eyes 3 (three) times daily.   Cholecalciferol (VITAMIN D3) 50 MCG (2000 UT) capsule Take 2,000 Units by mouth daily.   Magnesium 250 MG TABS Take 1 tablet (250 mg total) by mouth daily. (Patient not taking: Reported on 11/28/2020)   Multiple Vitamins-Minerals (SENIOR MULTIVITAMIN PLUS PO) Take by mouth.   valsartan-hydrochlorothiazide (DIOVAN-HCT) 160-12.5 MG tablet Take 1 tablet by mouth daily.   No facility-administered encounter medications on file as of 06/05/2021.    Patient Active Problem List   Diagnosis Date Noted   Hypertensive nephropathy 04/02/2019   Chronic renal disease, stage II 04/02/2019   Chronic pain of left knee 04/02/2019   Bilateral leg edema 04/02/2019   Vitamin D deficiency    Bradycardia 08/22/2013    Conditions to be addressed/monitored:  Hypertensive Nephropathy and Chronic Renal Disease Stage II ; Level of care concerns  Care Plan : Social Work [facility] Care Plan  Updates made by Daneen Schick since 06/05/2021  12:00 AM     Problem: Long-Term Care Planning      Long-Range Goal: Effective Long-Term Care Planning   Start Date: 06/05/2021  Expected End Date: 10/03/2021  Priority: Medium  Note:   Current Barriers:  Chronic disease management support and education needs related to   Hypertensive Nephropathy and Chronic Renal Disease Stage II   Level of care concerns  Social Worker Clinical Goal(s):  patient will work with SW to identify and address any acute and/or chronic care coordination needs related to the self health management of  Hypertensive Nephropathy and Chronic Renal Disease Stage II   Patient will engage with RN Care Manager to develop an individualized plan of care Patient and her daughter will work with SW to identify assisted living options in order to obtain a higher level of care  SW Interventions:  Inter-disciplinary care team collaboration (see longitudinal plan of care) Collaboration with Allison Chard, MD regarding development and update of comprehensive plan of care as evidenced by provider attestation and co-signature Successful outbound call placed to the patients daughter Allison Mills Conducted [facility]Do[facility] screen- no acute challenges noted at this time Discussed the patient is currently residing in independent living at [facility]  Patient recently had a fall a few weeks ago and laid on the floor for several hours. Due to this concern the patients daughter would like to increase the patients level of care to ALF Determined [name] has been in contact with [place], [place] on [address]. To schedule tours of the communities Provided [name] with the contact number to [facility] to obtain community information and schedule a tour if desired Discussed SW may provide assistance with placement once a desired community is selected by assisting with completion of an FL2 Determined [name] plans to [place] near the end of this month [name] reports she feels the patient is okay where she is at due to hiring a morning caregiver to assist with morning care as needed and make sure patient gets to breakfast Scheduled a follow up call to review placement progress on 7.8.22 Advised [name] to  contact SW sooner if needed Discussed follow up with SW to address placement while patient engages with  RN Case Manager  to address care management needs Scheduled follow up call over the next 30 days Collaboration with [place] regarding patient enrollment and plan  Patient Goals/Self-Care Activities patient will: with the help of her daughter Allison Mills  -  Tour local assisted living facilities to determine options for placement -Engage with [place] to develop an individualized plan of care -Contact SW as needed prior to next scheduled call  Follow Up Plan: The care management team will reach out to the patient again over the next 30 days.        Follow Up Plan: SW will follow up with patient by phone over the next month      Daneen Schick, BSW, CDP Social Worker, Certified Dementia Practitioner [place] / [place] [facility] [phone]  Total time spent performing care coordination and/or care management activities with the patient by phone or face to face = 55 minutes.

## 2021-06-06 DIAGNOSIS — Z20828 Contact with and (suspected) exposure to other viral communicable diseases: Secondary | ICD-10-CM | POA: Diagnosis not present

## 2021-06-10 DIAGNOSIS — Z20828 Contact with and (suspected) exposure to other viral communicable diseases: Secondary | ICD-10-CM | POA: Diagnosis not present

## 2021-06-13 DIAGNOSIS — Z20828 Contact with and (suspected) exposure to other viral communicable diseases: Secondary | ICD-10-CM | POA: Diagnosis not present

## 2021-06-17 DIAGNOSIS — Z20828 Contact with and (suspected) exposure to other viral communicable diseases: Secondary | ICD-10-CM | POA: Diagnosis not present

## 2021-06-20 DIAGNOSIS — H16223 Keratoconjunctivitis sicca, not specified as Sjogren's, bilateral: Secondary | ICD-10-CM | POA: Diagnosis not present

## 2021-06-20 DIAGNOSIS — Z20828 Contact with and (suspected) exposure to other viral communicable diseases: Secondary | ICD-10-CM | POA: Diagnosis not present

## 2021-06-20 DIAGNOSIS — H401133 Primary open-angle glaucoma, bilateral, severe stage: Secondary | ICD-10-CM | POA: Diagnosis not present

## 2021-06-20 DIAGNOSIS — H2512 Age-related nuclear cataract, left eye: Secondary | ICD-10-CM | POA: Diagnosis not present

## 2021-06-25 DIAGNOSIS — Z20828 Contact with and (suspected) exposure to other viral communicable diseases: Secondary | ICD-10-CM | POA: Diagnosis not present

## 2021-06-27 DIAGNOSIS — Z20828 Contact with and (suspected) exposure to other viral communicable diseases: Secondary | ICD-10-CM | POA: Diagnosis not present

## 2021-06-28 ENCOUNTER — Ambulatory Visit (INDEPENDENT_AMBULATORY_CARE_PROVIDER_SITE_OTHER): Payer: Medicare PPO

## 2021-06-28 DIAGNOSIS — E559 Vitamin D deficiency, unspecified: Secondary | ICD-10-CM

## 2021-06-28 DIAGNOSIS — I129 Hypertensive chronic kidney disease with stage 1 through stage 4 chronic kidney disease, or unspecified chronic kidney disease: Secondary | ICD-10-CM | POA: Diagnosis not present

## 2021-06-28 DIAGNOSIS — N182 Chronic kidney disease, stage 2 (mild): Secondary | ICD-10-CM

## 2021-06-28 NOTE — Chronic Care Management (AMB) (Signed)
Chronic Care Management    Social Work Note  06/28/2021 Name: Allison Mills MRN: [id number] DOB: [birth year]  Allison Mills is a 87 y.o. year old female who is a primary care patient of Dorothyann Peng, MD. The CCM team was consulted to assist the patient with chronic disease management and/or care coordination needs related to: Level of Care Concerns.   Engaged with patients daughter Jones Bales by phone  for follow up visit in response to provider referral for social work chronic care management and care coordination services.   Consent to Services:  The patient was given information about Chronic Care Management services, agreed to services, and gave verbal consent prior to initiation of services.  Please see initial visit note for detailed documentation.   Patient agreed to services and consent obtained.   Assessment: Review of patient past medical history, allergies, medications, and health status, including review of relevant consultants reports was performed today as part of a comprehensive evaluation and provision of chronic care management and care coordination services.     SDOH (Social Determinants of Health) assessments and interventions performed:    Advanced Directives Status: Not addressed in this encounter.  CCM Care Plan  No Known Allergies  Outpatient Encounter Medications as of 06/28/2021  Medication Sig   amLODipine (NORVASC) 2.5 MG tablet Take 1 tablet (2.5 mg total) by mouth daily.   bimatoprost (LUMIGAN) 0.01 % SOLN Place 1 drop into both eyes at bedtime.   brimonidine (ALPHAGAN P) 0.1 % SOLN Place 1 drop into both eyes 3 (three) times daily.   brinzolamide (AZOPT) 1 % ophthalmic suspension Place 1 drop into both eyes 3 (three) times daily.   Cholecalciferol (VITAMIN D3) 50 MCG (2000 UT) capsule Take 2,000 Units by mouth daily.   Magnesium 250 MG TABS Take 1 tablet (250 mg total) by mouth daily. (Patient not taking: Reported on 11/28/2020)   Multiple  Vitamins-Minerals (SENIOR MULTIVITAMIN PLUS PO) Take by mouth.   valsartan-hydrochlorothiazide (DIOVAN-HCT) 160-12.5 MG tablet Take 1 tablet by mouth daily.   No facility-administered encounter medications on file as of 06/28/2021.    Patient Active Problem List   Diagnosis Date Noted   Hypertensive nephropathy 04/02/2019   Chronic renal disease, stage II 04/02/2019   Chronic pain of left knee 04/02/2019   Bilateral leg edema 04/02/2019   Vitamin D deficiency    Bradycardia 08/22/2013    Conditions to be addressed/monitored:  Hypertensive Nephropathy, Chronic Renal Disease Stage 2, and Vitamin D Deficiency ; Level of care concerns  Care Plan : Social Work [facility] Care Plan  Updates made by Bevelyn Ngo since 06/28/2021 12:00 AM     Problem: Long-Term Care Planning      Long-Range Goal: Effective Long-Term Care Planning   Start Date: 06/05/2021  Expected End Date: 10/03/2021  This Visit's Progress: On track  Priority: Medium  Note:   Current Barriers:  Chronic disease management support and education needs related to  Hypertensive Nephropathy and Chronic Renal Disease Stage II   Level of care concerns  Social Worker Clinical Goal(s):  patient will work with SW to identify and address any acute and/or chronic care coordination needs related to the self health management of  Hypertensive Nephropathy and Chronic Renal Disease Stage II   Patient will engage with RN Care Manager to develop an individualized plan of care Patient and her daughter will work with SW to identify assisted living options in order to obtain a higher level of care  SW Interventions:  Inter-disciplinary care team collaboration (see longitudinal plan of care) Collaboration with Dorothyann Peng, MD regarding development and update of comprehensive plan of care as evidenced by provider attestation and co-signature Successful outbound call placed to the patients daughter Alford Highland to assess goal  progression Discussed Mrs. Barry Dienes has toured [address], [id number], and [facility] [place] Mrs. Barry Dienes indicates [address] is out of budget so they will be choosing between [employer] and [place] Determined Mrs. Barry Dienes has yet to inform the patient of plans to look for a higher level of care but does intend to inform her in the coming days Reviewed next steps will consist of the patient touring the communities and having an assessment completing to determine level of care charges Discussed plans for Mrs. Barry Dienes to contact SW once assessment has been completed if assistance is needed with FL2 completion Determined the patients savings will cover the cost of ALF for "a while" but not an extended period Mrs. Barry Dienes indicates if the patient runs out of funds while in ALF she will plan to move the patient into her home and use her monthly income to hire a caregiver SW will follow up with Mrs. Barry Dienes over the next 60 days  Patient Goals/Self-Care Activities patient will: with the help of her daughter Alford Highland  -  Complete level of care assessments at chosen ALF -Engage with RN Care Manager to develop an individualized plan of care -Contact SW as needed prior to next scheduled call  Follow Up Plan: The care management team will reach out to the patient again over the next 60 days.        Follow Up Plan: SW will follow up with patient by phone over the next 60 days      Bevelyn Ngo, BSW, CDP Social Worker, Certified Dementia Practitioner [facility] Care Management [phone]

## 2021-06-28 NOTE — Patient Instructions (Signed)
Social Worker Visit Information  Goals we discussed today:   Goals Addressed             This Visit's Progress    Effective Long-term Care Planning       Timeframe:  Long-Range Goal Priority:  Medium Start Date: 6.15.22                            Expected End Date:  10.13.22                     Next planned outreach: 7.8.22  Patient Goals/Self-Care Activities patient will: with the help of her daughter Alford Highland  - Complete level of care assessments at chosen ALF -Engage with RN Care Manager to develop an individualized plan of care -Contact SW as needed prior to next scheduled call          Materials Provided: Verbal education about placement process provided by phone  Follow Up Plan: SW will follow up with patient by phone over the next 60 days   Bevelyn Ngo, BSW, CDP Social Worker, Certified Dementia Practitioner [facility] Care Management [phone]

## 2021-07-01 DIAGNOSIS — Z20828 Contact with and (suspected) exposure to other viral communicable diseases: Secondary | ICD-10-CM | POA: Diagnosis not present

## 2021-07-03 ENCOUNTER — Telehealth: Payer: Self-pay

## 2021-07-03 ENCOUNTER — Telehealth: Payer: Self-pay | Admitting: Internal Medicine

## 2021-07-03 NOTE — Telephone Encounter (Signed)
The pt;s daughter was notified that the call earlier was to schedule her mom an appt for an AWV. Alford Highland the pt's daughter said that she would need to call back tomorrow.

## 2021-07-03 NOTE — Telephone Encounter (Signed)
Left message for patient to call back and schedule Medicare Annual Wellness Visit (AWV) either virtually or in office.   Last AWV 05/31/20 ; please schedule at anytime with [facility]    This should be a 45 minute visit.

## 2021-07-04 DIAGNOSIS — Z20828 Contact with and (suspected) exposure to other viral communicable diseases: Secondary | ICD-10-CM | POA: Diagnosis not present

## 2021-07-08 DIAGNOSIS — Z20828 Contact with and (suspected) exposure to other viral communicable diseases: Secondary | ICD-10-CM | POA: Diagnosis not present

## 2021-07-18 DIAGNOSIS — Z20828 Contact with and (suspected) exposure to other viral communicable diseases: Secondary | ICD-10-CM | POA: Diagnosis not present

## 2021-07-22 DIAGNOSIS — Z20828 Contact with and (suspected) exposure to other viral communicable diseases: Secondary | ICD-10-CM | POA: Diagnosis not present

## 2021-07-23 ENCOUNTER — Telehealth: Payer: Medicare PPO

## 2021-07-23 ENCOUNTER — Telehealth: Payer: Self-pay

## 2021-07-23 NOTE — Telephone Encounter (Signed)
  Care Management   Follow Up Note   07/23/2021 Name: Allison Mills MRN: [id number] DOB: 1916/10/15   Referred by: Dorothyann Peng, MD Reason for referral : Chronic Care Management (Initial RN CM Outreach )   An unsuccessful telephone outreach was attempted today. The patient was referred to the case management team for assistance with care management and care coordination.   Follow Up Plan: A HIPPA compliant phone message was left for the patient providing contact information and requesting a return call.   Delsa Sale, RN, BSN, CCM Care Management Coordinator [facility] Care Management/[facility]  Direct Phone: [phone]

## 2021-07-25 DIAGNOSIS — Z20828 Contact with and (suspected) exposure to other viral communicable diseases: Secondary | ICD-10-CM | POA: Diagnosis not present

## 2021-07-29 DIAGNOSIS — Z20828 Contact with and (suspected) exposure to other viral communicable diseases: Secondary | ICD-10-CM | POA: Diagnosis not present

## 2021-08-01 DIAGNOSIS — Z20828 Contact with and (suspected) exposure to other viral communicable diseases: Secondary | ICD-10-CM | POA: Diagnosis not present

## 2021-08-05 DIAGNOSIS — Z20828 Contact with and (suspected) exposure to other viral communicable diseases: Secondary | ICD-10-CM | POA: Diagnosis not present

## 2021-08-08 DIAGNOSIS — Z8616 Personal history of COVID-19: Secondary | ICD-10-CM | POA: Diagnosis not present

## 2021-08-15 ENCOUNTER — Telehealth: Payer: Self-pay

## 2021-08-15 ENCOUNTER — Telehealth: Payer: Medicare PPO

## 2021-08-15 DIAGNOSIS — Z20828 Contact with and (suspected) exposure to other viral communicable diseases: Secondary | ICD-10-CM | POA: Diagnosis not present

## 2021-08-15 NOTE — Telephone Encounter (Signed)
  Care Management   Follow Up Note   08/15/2021 Name: Allison Mills MRN: [id number] DOB: 10-13-1916   Referred by: Dorothyann Peng, MD Reason for referral : Chronic Care Management (Initial RN CM Outreach )   A second unsuccessful telephone outreach was attempted today. The patient was referred to the case management team for assistance with care management and care coordination.   Follow Up Plan: A HIPPA compliant phone message was left for the patient providing contact information and requesting a return call.   Delsa Sale, RN, BSN, CCM Care Management Coordinator [facility] Care Management/[facility]  Direct Phone: [phone]

## 2021-08-22 DIAGNOSIS — Z20822 Contact with and (suspected) exposure to covid-19: Secondary | ICD-10-CM | POA: Diagnosis not present

## 2021-08-29 ENCOUNTER — Telehealth: Payer: Self-pay

## 2021-08-29 ENCOUNTER — Telehealth: Payer: Medicare PPO

## 2021-08-29 DIAGNOSIS — Z8616 Personal history of COVID-19: Secondary | ICD-10-CM | POA: Diagnosis not present

## 2021-08-29 NOTE — Telephone Encounter (Signed)
  Care Management   Follow Up Note   08/29/2021 Name: Allison Mills MRN: [id number] DOB: June 08, 1916   Referred by: Dorothyann Peng, MD Reason for referral : Chronic Care Management (Unsuccessful call)   An unsuccessful telephone outreach was attempted today. The patient was referred to the case management team for assistance with care management and care coordination. SW left a HIPAA compliant voice message requesting a return call.  Follow Up Plan: The care management team will reach out to the patient again over the next 21 days.   Bevelyn Ngo, BSW, CDP Social Worker, Certified Dementia Practitioner [facility] Care Management [phone]

## 2021-09-05 DIAGNOSIS — Z20828 Contact with and (suspected) exposure to other viral communicable diseases: Secondary | ICD-10-CM | POA: Diagnosis not present

## 2021-09-09 ENCOUNTER — Other Ambulatory Visit: Payer: Self-pay | Admitting: Internal Medicine

## 2021-09-09 ENCOUNTER — Telehealth: Payer: Medicare PPO

## 2021-09-09 ENCOUNTER — Ambulatory Visit: Payer: Self-pay

## 2021-09-09 DIAGNOSIS — N182 Chronic kidney disease, stage 2 (mild): Secondary | ICD-10-CM

## 2021-09-09 DIAGNOSIS — I129 Hypertensive chronic kidney disease with stage 1 through stage 4 chronic kidney disease, or unspecified chronic kidney disease: Secondary | ICD-10-CM

## 2021-09-09 NOTE — Chronic Care Management (AMB) (Signed)
  Care Management   Follow Up Note   09/09/2021 Name: Allison Mills MRN: [id number] DOB: [birth year]   Referred by: Dorothyann Peng, MD Reason for referral : Chronic Care Management (Initial RN CM Outreach )   Third unsuccessful telephone outreach was attempted today. The patient was referred to the case management team for assistance with care management and care coordination. The patient's primary care provider has been notified of our unsuccessful attempts to make or maintain contact with the patient. The care management team is pleased to engage with this patient at any time in the future should he/she be interested in assistance from the care management team.   Follow Up Plan: We have been unable to make contact with the patient for follow up. The care management team is available to follow up with the patient after provider conversation with the patient regarding recommendation for care management engagement and subsequent re-referral to the care management team.   Delsa Sale, RN, BSN, CCM Care Management Coordinator [facility]  Direct Phone: [phone]

## 2021-09-12 ENCOUNTER — Encounter: Payer: Self-pay | Admitting: Internal Medicine

## 2021-09-12 DIAGNOSIS — Z20828 Contact with and (suspected) exposure to other viral communicable diseases: Secondary | ICD-10-CM | POA: Diagnosis not present

## 2021-09-18 ENCOUNTER — Encounter: Payer: Self-pay | Admitting: Internal Medicine

## 2021-10-03 DIAGNOSIS — Z8616 Personal history of COVID-19: Secondary | ICD-10-CM | POA: Diagnosis not present

## 2021-10-09 ENCOUNTER — Ambulatory Visit: Payer: Medicare PPO

## 2021-10-23 ENCOUNTER — Telehealth: Payer: Self-pay | Admitting: Internal Medicine

## 2021-10-23 NOTE — Telephone Encounter (Signed)
Spoke with patient daughter International aid/development worker) declined she will discuss with Dr Allyne Gee to see if pt needs

## 2021-10-31 ENCOUNTER — Emergency Department (HOSPITAL_COMMUNITY): Payer: Medicare PPO

## 2021-10-31 ENCOUNTER — Inpatient Hospital Stay (HOSPITAL_COMMUNITY)
Admission: EM | Admit: 2021-10-31 | Discharge: 2021-11-21 | DRG: 392 | Disposition: E | Payer: Medicare PPO | Attending: Internal Medicine | Admitting: Internal Medicine

## 2021-10-31 DIAGNOSIS — R410 Disorientation, unspecified: Secondary | ICD-10-CM | POA: Diagnosis present

## 2021-10-31 DIAGNOSIS — I6529 Occlusion and stenosis of unspecified carotid artery: Secondary | ICD-10-CM | POA: Diagnosis not present

## 2021-10-31 DIAGNOSIS — H409 Unspecified glaucoma: Secondary | ICD-10-CM | POA: Diagnosis present

## 2021-10-31 DIAGNOSIS — K5289 Other specified noninfective gastroenteritis and colitis: Secondary | ICD-10-CM | POA: Diagnosis present

## 2021-10-31 DIAGNOSIS — E872 Acidosis, unspecified: Secondary | ICD-10-CM | POA: Diagnosis present

## 2021-10-31 DIAGNOSIS — K4090 Unilateral inguinal hernia, without obstruction or gangrene, not specified as recurrent: Secondary | ICD-10-CM

## 2021-10-31 DIAGNOSIS — K5900 Constipation, unspecified: Secondary | ICD-10-CM

## 2021-10-31 DIAGNOSIS — Z79899 Other long term (current) drug therapy: Secondary | ICD-10-CM

## 2021-10-31 DIAGNOSIS — R188 Other ascites: Secondary | ICD-10-CM | POA: Diagnosis present

## 2021-10-31 DIAGNOSIS — I2510 Atherosclerotic heart disease of native coronary artery without angina pectoris: Secondary | ICD-10-CM | POA: Diagnosis present

## 2021-10-31 DIAGNOSIS — S0990XA Unspecified injury of head, initial encounter: Secondary | ICD-10-CM | POA: Diagnosis not present

## 2021-10-31 DIAGNOSIS — Z20822 Contact with and (suspected) exposure to covid-19: Secondary | ICD-10-CM | POA: Diagnosis present

## 2021-10-31 DIAGNOSIS — I129 Hypertensive chronic kidney disease with stage 1 through stage 4 chronic kidney disease, or unspecified chronic kidney disease: Secondary | ICD-10-CM | POA: Diagnosis present

## 2021-10-31 DIAGNOSIS — E1122 Type 2 diabetes mellitus with diabetic chronic kidney disease: Secondary | ICD-10-CM | POA: Diagnosis present

## 2021-10-31 DIAGNOSIS — D72829 Elevated white blood cell count, unspecified: Secondary | ICD-10-CM

## 2021-10-31 DIAGNOSIS — I499 Cardiac arrhythmia, unspecified: Secondary | ICD-10-CM | POA: Diagnosis not present

## 2021-10-31 DIAGNOSIS — N179 Acute kidney failure, unspecified: Secondary | ICD-10-CM | POA: Diagnosis present

## 2021-10-31 DIAGNOSIS — R739 Hyperglycemia, unspecified: Secondary | ICD-10-CM

## 2021-10-31 DIAGNOSIS — R569 Unspecified convulsions: Secondary | ICD-10-CM | POA: Diagnosis not present

## 2021-10-31 DIAGNOSIS — E1165 Type 2 diabetes mellitus with hyperglycemia: Secondary | ICD-10-CM | POA: Diagnosis present

## 2021-10-31 DIAGNOSIS — I959 Hypotension, unspecified: Secondary | ICD-10-CM | POA: Diagnosis present

## 2021-10-31 DIAGNOSIS — R079 Chest pain, unspecified: Secondary | ICD-10-CM | POA: Diagnosis not present

## 2021-10-31 DIAGNOSIS — R531 Weakness: Secondary | ICD-10-CM | POA: Diagnosis not present

## 2021-10-31 DIAGNOSIS — E860 Dehydration: Secondary | ICD-10-CM

## 2021-10-31 DIAGNOSIS — K439 Ventral hernia without obstruction or gangrene: Secondary | ICD-10-CM | POA: Diagnosis not present

## 2021-10-31 DIAGNOSIS — N390 Urinary tract infection, site not specified: Secondary | ICD-10-CM

## 2021-10-31 DIAGNOSIS — Z66 Do not resuscitate: Secondary | ICD-10-CM | POA: Diagnosis present

## 2021-10-31 DIAGNOSIS — K6389 Other specified diseases of intestine: Secondary | ICD-10-CM | POA: Diagnosis not present

## 2021-10-31 DIAGNOSIS — I452 Bifascicular block: Secondary | ICD-10-CM | POA: Diagnosis present

## 2021-10-31 DIAGNOSIS — R112 Nausea with vomiting, unspecified: Secondary | ICD-10-CM

## 2021-10-31 DIAGNOSIS — Z8049 Family history of malignant neoplasm of other genital organs: Secondary | ICD-10-CM

## 2021-10-31 DIAGNOSIS — R0902 Hypoxemia: Secondary | ICD-10-CM | POA: Diagnosis not present

## 2021-10-31 DIAGNOSIS — N182 Chronic kidney disease, stage 2 (mild): Secondary | ICD-10-CM | POA: Diagnosis present

## 2021-10-31 DIAGNOSIS — M852 Hyperostosis of skull: Secondary | ICD-10-CM | POA: Diagnosis not present

## 2021-10-31 DIAGNOSIS — Z743 Need for continuous supervision: Secondary | ICD-10-CM | POA: Diagnosis not present

## 2021-10-31 DIAGNOSIS — I10 Essential (primary) hypertension: Secondary | ICD-10-CM | POA: Diagnosis not present

## 2021-10-31 DIAGNOSIS — K449 Diaphragmatic hernia without obstruction or gangrene: Secondary | ICD-10-CM | POA: Diagnosis not present

## 2021-10-31 LAB — CBC WITH DIFFERENTIAL/PLATELET
Abs Immature Granulocytes: 0.42 10*3/uL — ABNORMAL HIGH (ref 0.00–0.07)
Basophils Absolute: 0 10*3/uL (ref 0.0–0.1)
Basophils Relative: 0 %
Eosinophils Absolute: 0 10*3/uL (ref 0.0–0.5)
Eosinophils Relative: 0 %
HCT: 46 % (ref 36.0–46.0)
Hemoglobin: 15.5 g/dL — ABNORMAL HIGH (ref 12.0–15.0)
Immature Granulocytes: 3 %
Lymphocytes Relative: 7 %
Lymphs Abs: 1 10*3/uL (ref 0.7–4.0)
MCH: 31.2 pg (ref 26.0–34.0)
MCHC: 33.7 g/dL (ref 30.0–36.0)
MCV: 92.6 fL (ref 80.0–100.0)
Monocytes Absolute: 0.6 10*3/uL (ref 0.1–1.0)
Monocytes Relative: 5 %
Neutro Abs: 10.9 10*3/uL — ABNORMAL HIGH (ref 1.7–7.7)
Neutrophils Relative %: 85 %
Platelets: 195 10*3/uL (ref 150–400)
RBC: 4.97 MIL/uL (ref 3.87–5.11)
RDW: 13 % (ref 11.5–15.5)
WBC: 12.9 10*3/uL — ABNORMAL HIGH (ref 4.0–10.5)
nRBC: 0 % (ref 0.0–0.2)

## 2021-10-31 LAB — URINALYSIS, ROUTINE W REFLEX MICROSCOPIC
Bilirubin Urine: NEGATIVE
Glucose, UA: NEGATIVE mg/dL
Hgb urine dipstick: NEGATIVE
Ketones, ur: NEGATIVE mg/dL
Nitrite: NEGATIVE
Protein, ur: NEGATIVE mg/dL
Specific Gravity, Urine: 1.018 (ref 1.005–1.030)
pH: 7 (ref 5.0–8.0)

## 2021-10-31 LAB — COMPREHENSIVE METABOLIC PANEL
ALT: 10 U/L (ref 0–44)
AST: 28 U/L (ref 15–41)
Albumin: 3 g/dL — ABNORMAL LOW (ref 3.5–5.0)
Alkaline Phosphatase: 45 U/L (ref 38–126)
Anion gap: 10 (ref 5–15)
BUN: 30 mg/dL — ABNORMAL HIGH (ref 8–23)
CO2: 25 mmol/L (ref 22–32)
Calcium: 9.5 mg/dL (ref 8.9–10.3)
Chloride: 103 mmol/L (ref 98–111)
Creatinine, Ser: 1.42 mg/dL — ABNORMAL HIGH (ref 0.44–1.00)
GFR, Estimated: 33 mL/min — ABNORMAL LOW (ref >60)
Glucose, Bld: 187 mg/dL — ABNORMAL HIGH (ref 70–99)
Potassium: 4.3 mmol/L (ref 3.5–5.1)
Sodium: 138 mmol/L (ref 135–145)
Total Bilirubin: 1.1 mg/dL (ref 0.3–1.2)
Total Protein: 5.9 g/dL — ABNORMAL LOW (ref 6.5–8.1)

## 2021-10-31 LAB — LACTIC ACID, PLASMA
Lactic Acid, Venous: 5.4 mmol/L (ref 0.5–1.9)
Lactic Acid, Venous: 4.5 mmol/L (ref 0.5–1.9)

## 2021-10-31 LAB — RESP PANEL BY RT-PCR (FLU A&B, COVID) ARPGX2
Influenza A by PCR: NEGATIVE
Influenza B by PCR: NEGATIVE
SARS Coronavirus 2 by RT PCR: NEGATIVE

## 2021-10-31 LAB — TROPONIN I (HIGH SENSITIVITY)
Troponin I (High Sensitivity): 15 ng/L (ref <18)
Troponin I (High Sensitivity): 12 ng/L (ref <18)

## 2021-10-31 LAB — LIPASE, BLOOD: Lipase: 39 U/L (ref 11–51)

## 2021-10-31 LAB — CK: Total CK: 74 U/L (ref 38–234)

## 2021-10-31 MED ORDER — LACTATED RINGERS IV BOLUS
1000.0000 mL | Freq: Once | INTRAVENOUS | Status: AC
  Administered 2021-10-31: 1000 mL via INTRAVENOUS

## 2021-10-31 MED ORDER — ONDANSETRON HCL 4 MG/2ML IJ SOLN
4.0000 mg | Freq: Once | INTRAMUSCULAR | Status: AC
  Administered 2021-10-31: 4 mg via INTRAVENOUS
  Filled 2021-10-31: qty 2

## 2021-10-31 MED ORDER — PIPERACILLIN-TAZOBACTAM IN DEX 2-0.25 GM/50ML IV SOLN
2.2500 g | Freq: Three times a day (TID) | INTRAVENOUS | Status: DC
  Administered 2021-10-31 – 2021-11-01 (×2): 2.25 g via INTRAVENOUS
  Filled 2021-10-31 (×4): qty 50

## 2021-10-31 MED ORDER — SODIUM CHLORIDE 0.9 % IV BOLUS
1000.0000 mL | Freq: Once | INTRAVENOUS | Status: AC
  Administered 2021-10-31: 1000 mL via INTRAVENOUS

## 2021-10-31 MED ORDER — SODIUM CHLORIDE 0.9 % IV SOLN: Freq: Once | INTRAVENOUS | Status: DC

## 2021-10-31 NOTE — ED Notes (Signed)
Called lab, they will add on Lipase.

## 2021-10-31 NOTE — ED Provider Notes (Signed)
[facility] EMERGENCY DEPARTMENT Provider Note   CSN: [id number] Arrival date & time: 10/24/2021  1202     History Chief Complaint  Patient presents with   Lytle Michaels    Allison Mills is a 87 y.o. female.  HPI  87 year old female past medical history of CAD, HTN presents emergency department possible fall.  Patient lives at a independent senior living facility.  Patient did not show up at breakfast.  When they went to check on her they found her sitting on the ground on her bottom.  Patient is hard of hearing but oriented.  She states that she fell.  Denies head injury.  Denies syncope.  But otherwise is unclear how reliable of a historian she is.  She has no complaints at this time but the daughter at bedside and caregiver state that she has seemed confused over the last 2 days and more weak than usual.  Has had an episode similar to this in the past and was diagnosed with UTI.  Of note there was also concern for possible seizure-like activity prior to EMS arrival that they described as "frothing at the mouth".  There was no reported loss of consciousness, incontinence, shaking.  No seizure-like activity with EMS.  Patient is otherwise been in her usual state of health, taking medications and eating and drinking.  Past Medical History:  Diagnosis Date   Coronary artery disease    Hypertension    Murmur    RBBB (right bundle branch block)     Patient Active Problem List   Diagnosis Date Noted   Hypertensive nephropathy 04/02/2019   Chronic renal disease, stage II 04/02/2019   Chronic pain of left knee 04/02/2019   Bilateral leg edema 04/02/2019   Vitamin D deficiency    Bradycardia 08/22/2013    No past surgical history on file.   OB History   No obstetric history on file.     Family History  Problem Relation Age of Onset   Uterine cancer Mother    Healthy Father    Heart Problems Other     Social History   Tobacco Use   Smoking status: Never    Smokeless tobacco: Never  Vaping Use   Vaping Use: Never used  Substance Use Topics   Alcohol use: No   Drug use: No    Home Medications Prior to Admission medications   Medication Sig Start Date End Date Taking? Authorizing Provider  amLODipine (NORVASC) 2.5 MG tablet TAKE 1 TABLET(2.5 MG) BY MOUTH DAILY Patient taking differently: Take 2.5 mg by mouth daily. 09/11/21  Yes Glendale Chard, MD  bimatoprost (LUMIGAN) 0.01 % SOLN Place 1 drop into both eyes at bedtime. 11/24/19  Yes Glendale Chard, MD  brimonidine (ALPHAGAN P) 0.1 % SOLN Place 1 drop into both eyes 2 (two) times daily.   Yes [provider]  brinzolamide (AZOPT) 1 % ophthalmic suspension Place 1 drop into both eyes 2 (two) times daily.   Yes [provider]  valsartan-hydrochlorothiazide (DIOVAN-HCT) 160-12.5 MG tablet Take 1 tablet by mouth daily. 11/28/20  Yes Glendale Chard, MD  Magnesium 250 MG TABS Take 1 tablet (250 mg total) by mouth daily. Patient not taking: No sig reported 06/14/19   Glendale Chard, MD    Allergies    Patient has no known allergies.  Review of Systems   Review of Systems  Constitutional:  Positive for fatigue. Negative for chills and fever.  HENT:  Negative for congestion.   Eyes:  Negative for visual disturbance.  Respiratory:  Negative for shortness of breath.   Cardiovascular:  Negative for chest pain.  Gastrointestinal:  Negative for abdominal pain, diarrhea and vomiting.  Genitourinary:  Negative for dysuria.  Musculoskeletal:  Negative for back pain and neck pain.       No extremity pain  Skin:  Negative for rash.  Neurological:  Negative for headaches.  Psychiatric/Behavioral:  Positive for confusion.    Physical Exam Updated Vital Signs BP 114/65   Pulse (!) 54   Temp 97.6 F (36.4 C) (Temporal)   Resp 17   SpO2 98%   Physical Exam Vitals and nursing note reviewed.  Constitutional:      General: She is not in acute distress.    Appearance: Normal  appearance.  HENT:     Head: Normocephalic.     Mouth/Throat:     Mouth: Mucous membranes are moist.  Eyes:     Pupils: Pupils are equal, round, and reactive to light.  Cardiovascular:     Rate and Rhythm: Normal rate.  Pulmonary:     Effort: Pulmonary effort is normal. No respiratory distress.  Abdominal:     Palpations: Abdomen is soft.     Tenderness: There is no abdominal tenderness.  Musculoskeletal:        General: No swelling or deformity.     Comments: Pelvis stable, no obvious findings of musculoskeletal injury/deformity  Skin:    General: Skin is warm.  Neurological:     Mental Status: She is alert and oriented to person, place, and time. Mental status is at baseline.  Psychiatric:        Mood and Affect: Mood normal.    ED Results / Procedures / Treatments   Labs (all labs ordered are listed, but only abnormal results are displayed) Labs Reviewed  CBC WITH DIFFERENTIAL/PLATELET - Abnormal; Notable for the following components:      Result Value   WBC 12.9 (*)    Hemoglobin 15.5 (*)    Neutro Abs 10.9 (*)    Abs Immature Granulocytes 0.42 (*)    All other components within normal limits  COMPREHENSIVE METABOLIC PANEL - Abnormal; Notable for the following components:   Glucose, Bld 187 (*)    BUN 30 (*)    Creatinine, Ser 1.42 (*)    Total Protein 5.9 (*)    Albumin 3.0 (*)    GFR, Estimated 33 (*)    All other components within normal limits  URINALYSIS, ROUTINE W REFLEX MICROSCOPIC    EKG EKG Interpretation  Date/Time:  Thursday October 31 2021 12:06:40 EST Ventricular Rate:  57 PR Interval:  206 QRS Duration: 130 QT Interval:  452 QTC Calculation: 441 R Axis:   126 Text Interpretation: Sinus rhythm RBBB and LPFB NSR Confirmed by Lavenia Atlas [phone]) on 11/13/2021 2:41:35 PM  Radiology No results found.  Procedures Procedures   Medications Ordered in ED Medications - No data to display  ED Course  I have reviewed the triage vital  signs and the nursing notes.  Pertinent labs & imaging results that were available during my care of the patient were reviewed by me and considered in my medical decision making (see chart for details).    MDM Rules/Calculators/A&P                           87 year old female presents the emergency department after being found sitting on her floor after missing breakfast  this morning.  Patient herself believes she may have falling.  Denies any syncope.  There was questionable seizure-like activity described as "frothing at the mouth".  Vitals are stable on arrival, she has no active complaints.  Family endorses confusion and fatigue. No CP or SOB.  Blood work is reassuring without any acute abnormalities.  EKG is normal sinus with no abnormal findings.  We are pending urinalysis and CT of the head.  Patient signed out to oncoming physician pending work-up completion and reevaluation.  Final Clinical Impression(s) / ED Diagnoses Final diagnoses:  None    Rx / DC Orders ED Discharge Orders     None        Rozelle Logan, DO Nov 25, 2021 1511

## 2021-10-31 NOTE — ED Notes (Signed)
Entered room to introduce self to pt. Pt disconnected from monitor. Replaced pt on monitor, recycled BP which was WNL. Pt and daughter updated to plan and verbalized understanding

## 2021-10-31 NOTE — H&P (Addendum)
History and Physical  Allison Mills [id number] DOB: [birth year] DOA: 11/15/2021  Referring physician: Rozelle Logan, DO PCP: Dorothyann Peng, MD  Patient coming from: Independent Senior living facility  Chief Complaint: Fall   HPI: Allison Mills is a 87 y.o. female with medical history significant for hypertension, CAD, glaucoma who presents to the emergency department via EMS due to reported fall sustained at the living facility PTA. Patient was unable to provide history, this was obtained from ED physician and daughter at bedside.  Per report, patient lives at an independent senior living facility and has an aide that assists her during the day and 1 in the evening.  Patient was noted not to show up for breakfast at the facility this morning, so the staff checked on her and she was noted to be sitting on the floor by the bedside, when asked if she fell patient states that she went to use the restroom and then came back to sit on the bed (however, patient was noted to be sitting on the floor), she denies any head injury.  Per ED medical record, patient's caregiver reported that patient seemed confused since last 2 days and was weaker than usual.  There was concern for UTI since patient's presentation was similar to last time she was diagnosed to have UTI.  There was also concern for possible seizure due to some "frothing at the mouth", but there was no reported tongue bite, incontinence, shaking, loss of consciousness.  EMS was activated and patient was taken to the ED for further evaluation and management.  No reported seizure-like activity by EMS team.  Patient denies any burning sensation on urination or any other irritative bladder symptoms.  ED Course:  In the emergency department, she was bradycardic, but other vital signs were within normal range.  Work-up in the ED showed leukocytosis, hyperglycemia, BUN/creatinine 30/1.42 (baseline creatinine is 0.7-0.8), troponin x2- 15 > 12,  lipase 39, total CK 74, lactic acid 5.4 > 4.5.  Influenza A, B, SARS coronavirus 2 was negative. CT of head showed chronic changes with no acute intracranial process Chest x-ray showed no acute cardiopulmonary disease CT abdomen and pelvis without contrast showed Large right groin hernia contains the cecum and ileocecal junction. The cecum in the hernia sac is mildly distended. There is fluid in the hernia sac and generalized stranding in the right groin but uncertain if this is due to incarceration versus generalized body edema as well as the presence of ascites within the abdomen and pelvis. There is no bowel obstruction upstream to this and there is stool present within the colon distal to the hernia. Patient was reported to present with vomiting while in the ED, CT abdomen was suggestive of stercoral colitis.  General surgery was consulted due to large right inguinal hernia. Patient was started on IV Zosyn, IV hydration was provided and Dulcolax suppository was given.  Hospitalist was asked to admit patient for further evaluation and management.  Review of Systems: Constitutional: Positive for fatigue.  Negative for chills and fever.  HENT: Positive for hard of hearing.  Negative for ear pain and sore throat.   Eyes: Negative for pain and visual disturbance.  Respiratory: Negative for cough, chest tightness and shortness of breath.   Cardiovascular: Negative for chest pain and palpitations.  Gastrointestinal: Negative for abdominal pain and vomiting.  Endocrine: Negative for polyphagia and polyuria.  Genitourinary: Negative for decreased urine volume, dysuria, enuresis Musculoskeletal: Negative for arthralgias and back pain.  Skin: Negative  for color change and rash.  Allergic/Immunologic: Negative for immunocompromised state.  Neurological: Negative for tremors, syncope, speech difficulty, weakness, light-headedness and headaches.  Hematological: Does not bruise/bleed easily.  All other  systems reviewed and are negative   Past Medical History:  Diagnosis Date   Coronary artery disease    Hypertension    Murmur    RBBB (right bundle branch block)    History reviewed. No pertinent surgical history.  Social History:  reports that she has never smoked. She has never used smokeless tobacco. She reports that she does not drink alcohol and does not use drugs.   No Known Allergies  Family History  Problem Relation Age of Onset   Uterine cancer Mother    Healthy Father    Heart Problems Other      Prior to Admission medications   Medication Sig Start Date End Date Taking? Authorizing Provider  amLODipine (NORVASC) 2.5 MG tablet TAKE 1 TABLET(2.5 MG) BY MOUTH DAILY Patient taking differently: Take 2.5 mg by mouth daily. 09/11/21  Yes Glendale Chard, MD  brimonidine (ALPHAGAN P) 0.1 % SOLN Place 1 drop into both eyes 2 (two) times daily.   Yes [provider]  dorzolamide (TRUSOPT) 2 % ophthalmic solution Place 1 drop into both eyes 2 (two) times daily.   Yes [provider]  latanoprost (XALATAN) 0.005 % ophthalmic solution Place 1 drop into both eyes at bedtime.   Yes [provider]  valsartan-hydrochlorothiazide (DIOVAN-HCT) 160-12.5 MG tablet Take 1 tablet by mouth daily. 11/28/20  Yes Glendale Chard, MD  bimatoprost (LUMIGAN) 0.01 % SOLN Place 1 drop into both eyes at bedtime. Patient not taking: Reported on 11/09/2021 11/24/19   Glendale Chard, MD  brinzolamide (AZOPT) 1 % ophthalmic suspension Place 1 drop into both eyes 2 (two) times daily. Patient not taking: Reported on 11/12/2021    [provider]  Magnesium 250 MG TABS Take 1 tablet (250 mg total) by mouth daily. Patient not taking: No sig reported 06/14/19   Glendale Chard, MD    Physical Exam: BP 99/66 (BP Location: Right Arm)   Pulse (!) 57   Temp 98 F (36.7 C)   Resp 17   Wt 64.6 kg   SpO2 99%   BMI 23.71 kg/m   General: 87 y.o. year-old female well  developed well nourished in no acute distress.  Alert and oriented x3. HEENT: Dry mucous membrane.  NCAT, EOMI Neck: Supple, trachea medial Cardiovascular: Regular rate and rhythm with no rubs or gallops.  No thyromegaly or JVD noted.  No lower extremity edema. 2/4 pulses in all 4 extremities. Respiratory: Clear to auscultation with no wheezes or rales. Good inspiratory effort. Abdomen: Soft, nontender nondistended with normal bowel sounds x4 quadrants. Muskuloskeletal: No cyanosis, clubbing or edema noted bilaterally Neuro: CN II-XII intact, strength 5/5 x 4, sensation, reflexes intact Skin: No ulcerative lesions noted or rashes Psychiatry: Mood is appropriate for condition and setting          Labs on Admission:  Basic Metabolic Panel: Recent Labs  Lab 11/09/2021 1349 11/06/21 0130  NA 138 136  K 4.3 3.8  CL 103 104  CO2 25 19*  GLUCOSE 187* 226*  BUN 30* 34*  CREATININE 1.42* 1.58*  CALCIUM 9.5 8.9  MG  --  2.2  PHOS  --  4.7*   Liver Function Tests: Recent Labs  Lab 11/06/2021 1349 2021-11-06 0130  AST 28 27  ALT 10 14  ALKPHOS 45 36*  BILITOT  1.1 1.1  PROT 5.9* 5.1*  ALBUMIN 3.0* 2.5*   Recent Labs  Lab 11/12/2021 1813  LIPASE 39   No results for input(s): AMMONIA in the last 168 hours. CBC: Recent Labs  Lab 11/07/2021 1349  WBC 12.9*  NEUTROABS 10.9*  HGB 15.5*  HCT 46.0  MCV 92.6  PLT 195   Cardiac Enzymes: Recent Labs  Lab 11/17/2021 1410  CKTOTAL 74    BNP (last 3 results) No results for input(s): BNP in the last 8760 hours.  ProBNP (last 3 results) No results for input(s): PROBNP in the last 8760 hours.  CBG: No results for input(s): GLUCAP in the last 168 hours.  Radiological Exams on Admission: CT ABDOMEN PELVIS WO CONTRAST  Result Date: 11/05/2021 CLINICAL DATA:  Abdominal pain EXAM: CT ABDOMEN AND PELVIS WITHOUT CONTRAST TECHNIQUE: Multidetector CT imaging of the abdomen and pelvis was performed following the standard protocol without  IV contrast. COMPARISON:  None. FINDINGS: Lower chest: Lung bases demonstrate trace left effusion. No acute consolidation. Normal cardiac size. Small moderate hiatal hernia. Hepatobiliary: No calcified gallstone. No biliary dilatation. No focal hepatic abnormality. Pancreas: No ductal dilatation. Generalized edema in the mesentery with abdominopelvic ascites. Spleen: Normal in size without focal abnormality. Adrenals/Urinary Tract: Adrenal glands are normal. Cyst upper pole left kidney. No hydronephrosis. Slight anterior positioning of left kidney. The bladder is distended but otherwise unremarkable. Stomach/Bowel: The stomach is nonenlarged. No dilated small bowel. Right groin hernia contains the cecum which is slightly air distended and contains some stool. There is no dilated bowel upstream to this. Large amount of stool in the transverse and descending colon. Possible mild wall thickening and stranding at the splenic flexure and proximal descending colon Vascular/Lymphatic: Advanced aortic atherosclerosis. No aneurysm. No suspicious nodes Reproductive: Not confidently visualized and is either atrophic or surgically absent. There is no adnexal mass lesion Other: No free air. Small volume of abdominal ascites with moderate ascites in the pelvis. Generalized subcutaneous edema. Large right groin hernia contains the cecum and mesentery. There is fluid within the hernia sac and mild stranding. Musculoskeletal: Degenerative changes. No acute osseous abnormality. IMPRESSION: 1. Large right groin hernia contains the cecum and ileocecal junction. The cecum in the hernia sac is mildly distended. There is fluid in the hernia sac and generalized stranding in the right groin but uncertain if this is due to incarceration versus generalized body edema as well as the presence of ascites within the abdomen and pelvis. There is no bowel obstruction upstream to this and there is stool present within the colon distal to the hernia  2. Large feces at the splenic flexure and proximal descending colon with slight colon distension. There may be slight wall thickening and surrounding stranding which could be seen with stercoral colitis. 3. Small abdominal ascites and moderate pelvic ascites. Generalized mild subcutaneous edema Electronically Signed   By: Donavan Foil M.D.   On: 10/27/2021 19:37   CT Head Wo Contrast  Result Date: 11/08/2021 CLINICAL DATA:  Head trauma EXAM: CT HEAD WITHOUT CONTRAST TECHNIQUE: Contiguous axial images were obtained from the base of the skull through the vertex without intravenous contrast. COMPARISON:  None. FINDINGS: Brain: No acute intracranial hemorrhage, mass effect, or herniation. No extra-axial fluid collections. No evidence of acute territorial infarct. No hydrocephalus. Mild cortical volume loss. Patchy hypodensities in the periventricular and subcortical white matter, likely secondary to chronic microvascular ischemic changes. Vascular: Calcified plaques in the carotid siphons. Skull: Hyperostosis frontalis interna. No acute skull fracture identified.  Sinuses/Orbits: No acute finding. Other: None. IMPRESSION: Chronic changes with no acute intracranial process identified. Electronically Signed   By: Ofilia Neas M.D.   On: 11/06/2021 15:45   DG Chest Portable 1 View  Result Date: 11/07/2021 CLINICAL DATA:  Chest pain and weakness EXAM: PORTABLE CHEST 1 VIEW COMPARISON:  None. FINDINGS: Numerous leads and wires project over the chest. Patient rotated right. Normal heart size. Tortuous thoracic aorta. No pleural effusion or pneumothorax. Mild subsegmental atelectasis or scarring at both lung bases. No lobar consolidation. IMPRESSION: No acute cardiopulmonary disease. Electronically Signed   By: Abigail Miyamoto M.D.   On: 10/30/2021 19:07    EKG: I independently viewed the EKG done and my findings are as followed: Sinus bradycardia at a rate of 57 bpm with RBBB and LPFB  Assessment/Plan Present  on Admission: **None**  Principal Problem:   Right inguinal hernia Active Problems:   Stercoral colitis   Leukocytosis   AKI (acute kidney injury) (Reader)   Hyperglycemia   Lactic acidosis   Dehydration   Constipation  Acute stercoral colitis CT abdomen pelvis was suggestive of stercoral colitis Continue IV Zosyn Continue Tylenol as needed  Leukocytosis possibly secondary to above vs reactive/hemoconcentration WBC 12.9, continue to monitor WBC with morning labs  Constipation CT abdomen and pelvis showed likely cystitis splenic flexure and proximal descending colon with slight colon distention Continue Dulcolax, MiraLAX  Right inguinal hernia General surgery (Dr. Redmond Pulling) was consulted and thinks that the hernia was probably chronic No need for any urgent surgical intervention at this time per surgical medical record  Acute kidney injury/dehydration BUN/creatinine 30/1.42 (baseline creatinine is 0.7-0.8) Continue gentle hydration Renally adjust medications, avoid nephrotoxic agents/dehydration/hypotension  Lactic acidosis Lactic acid 5.4 > 4.5 > 6.1  Hyperglycemia possibly reactive CBG 187, patient has a history of type 2 diabetes mellitus Hemoglobin A1c will be checked  Essential hypertension BP meds will be held at this time due to soft BP  History of CAD Stable, patient was on no home medication per med rec We shall await updated med rec  Glaucoma Continue latanoprost   DVT prophylaxis: Lovenox  Code Status: Full code  Family Communication: Daughter at bedside (all questions answered to satisfaction)   Disposition Plan:  Patient is from:                        home Anticipated DC to:                   SNF or family members home Anticipated DC date:               2-3 days Anticipated DC barriers:          Patient requires inpatient management due to colitis requiring IV antibiotics, acute kidney injury and dehydration requiring IV hydration   Consults  called: General surgery (by ED team)  Admission status: Observation  Bernadette Hoit MD [facility]  11/25/2021, 2:42 AM

## 2021-10-31 NOTE — ED Notes (Signed)
Schlossman MD notified of pt BP.

## 2021-10-31 NOTE — Consult Note (Signed)
CC: unable to obtain  Requesting provider: Dr Billy Fischer  HPI: Allison Mills is an 87 y.o. female who is here for evaluation after she did not show up for breakfast and when the staff went to check on her she was sitting on the ground and told them that she had fallen.  Earlier today the caregivers and daughter at the bedside had told the ER staff that the patient had seemed confused over the last 2 days and more weak than usual.  Reportedly she had a similar event back in the spring and was diagnosed with a UTI.  EMS reported that they had noticed some "frothing at the mouth".  There was no reported loss of consciousness or seizure-like activity with EMS.  Patient lives in a independent senior living facility in her own apartment. Her initial work-up focused on ruling out an injury due to her fall as well as a potential cause of her fall.  She had negative head CT.  She ultimately underwent CT abdomen pelvis which revealed a large right inguinal hernia containing a portion of cecum and ileocecal valve and mesentery, large stool burden in the transverse and proximal colon and questionable wall thickness in the proximal descending colon.  ED physician was unable to reduce the inguinal hernia and called [place] for evaluation.  Patient was also found to have AKI.  She had an elevated lactate.  She has had some intermittent hypotension in the ED as well.  The ED physician reported that the patient had no abdominal discomfort on exam but she initially pointed to her left side when asked where her discomfort was  Patient tells me that she has been eating and drinking normally the past few days.  She states that she normally has a bowel movement every other day but has not had a bowel movement a few days.  She denies any fever chills nausea or vomiting but she has had a small episode of spit up in the emergency room.  Patient reports that she has had some prior abdominal surgery for tubal ligation and a  hysterectomy. Past Medical History:  Diagnosis Date   Coronary artery disease    Hypertension    Murmur    RBBB (right bundle branch block)     No past surgical history on file.  Family History  Problem Relation Age of Onset   Uterine cancer Mother    Healthy Father    Heart Problems Other     Social:  reports that she has never smoked. She has never used smokeless tobacco. She reports that she does not drink alcohol and does not use drugs.  Allergies: No Known Allergies  Medications: I have reviewed the patient's current medications.   ROS - all of the below systems have been reviewed with the patient and positives are indicated with bold text General: chills, fever or night sweats Eyes: blurry vision or double vision ENT: epistaxis or sore throat Allergy/Immunology: itchy/watery eyes or nasal congestion Hematologic/Lymphatic: bleeding problems, blood clots or swollen lymph nodes Endocrine: temperature intolerance or unexpected weight changes Breast: new or changing breast lumps or nipple discharge Resp: cough, shortness of breath, or wheezing CV: chest pain or dyspnea on exertion GI: as per HPI GU: dysuria, trouble voiding, or hematuria MSK: joint pain or joint stiffness Neuro: TIA or stroke symptoms Derm: pruritus and skin lesion changes Psych: anxiety and depression  PE Blood pressure 129/79, pulse 65, temperature 97.6 F (36.4 C), temperature source Temporal, resp. rate 15, weight  68 kg, SpO2 97 %. Constitutional: NAD; conversant; no deformities, resting comfortably Eyes: Moist conjunctiva; no lid lag; anicteric; PERRL Neck: Trachea midline; no thyromegaly Lungs: Normal respiratory effort; no tactile fremitus CV: RRR; no palpable thrills; no pitting edema GI: Abd soft, completely nontender, no guarding; large right inguinal hernia- reducible, but herniates again shortly after reduction- no grimacing while reducing; 2 old vertical lower midline scars; no palpable  hepatosplenomegaly MSK: no clubbing/cyanosis; no palpable deformity Psychiatric: Appropriate affect; alert and oriented x3 (22, November, [place]);  Lymphatic: No palpable cervical or axillary lymphadenopathy Skin:no obvious rashes/sores  Results for orders placed or performed during the hospital encounter of 11/29/2021 (from the past 48 hour(s))  CBC with Differential     Status: Abnormal   Collection Time: Nov 29, 2021  1:49 PM  Result Value Ref Range   WBC 12.9 (H) 4.0 - 10.5 K/uL   RBC 4.97 3.87 - 5.11 MIL/uL   Hemoglobin 15.5 (H) 12.0 - 15.0 g/dL   HCT 14.9 70.2 - 63.7 %   MCV 92.6 80.0 - 100.0 fL   MCH 31.2 26.0 - 34.0 pg   MCHC 33.7 30.0 - 36.0 g/dL   RDW 85.8 85.0 - 27.7 %   Platelets 195 150 - 400 K/uL   nRBC 0.0 0.0 - 0.2 %   Neutrophils Relative % 85 %   Neutro Abs 10.9 (H) 1.7 - 7.7 K/uL   Lymphocytes Relative 7 %   Lymphs Abs 1.0 0.7 - 4.0 K/uL   Monocytes Relative 5 %   Monocytes Absolute 0.6 0.1 - 1.0 K/uL   Eosinophils Relative 0 %   Eosinophils Absolute 0.0 0.0 - 0.5 K/uL   Basophils Relative 0 %   Basophils Absolute 0.0 0.0 - 0.1 K/uL   Immature Granulocytes 3 %   Abs Immature Granulocytes 0.42 (H) 0.00 - 0.07 K/uL    Comment: Performed at [facility], [address], [place], [place] [address]  Comprehensive metabolic panel     Status: Abnormal   Collection Time: 11-29-21  1:49 PM  Result Value Ref Range   Sodium 138 135 - 145 mmol/L   Potassium 4.3 3.5 - 5.1 mmol/L   Chloride 103 98 - 111 mmol/L   CO2 25 22 - 32 mmol/L   Glucose, Bld 187 (H) 70 - 99 mg/dL    Comment: Glucose reference range applies only to samples taken after fasting for at least 8 hours.   BUN 30 (H) 8 - 23 mg/dL   Creatinine, Ser 8.67 (H) 0.44 - 1.00 mg/dL   Calcium 9.5 8.9 - 67.2 mg/dL   Total Protein 5.9 (L) 6.5 - 8.1 g/dL   Albumin 3.0 (L) 3.5 - 5.0 g/dL   AST 28 15 - 41 U/L   ALT 10 0 - 44 U/L   Alkaline Phosphatase 45 38 - 126 U/L   Total Bilirubin 1.1 0.3 - 1.2 mg/dL    GFR, Estimated 33 (L) >60 mL/min    Comment: (NOTE) Calculated using the CKD-EPI Creatinine Equation (2021)    Anion gap 10 5 - 15    Comment: Performed at [facility], [address] [place], [place] [address]  Troponin I (High Sensitivity)     Status: None   Collection Time: 2021/11/29  2:10 PM  Result Value Ref Range   Troponin I (High Sensitivity) 15 <18 ng/L    Comment: (NOTE) Elevated high sensitivity troponin I (hsTnI) values and significant  changes across serial measurements may suggest ACS but  many other  chronic and acute conditions are known to elevate hsTnI results.  Refer to the "Links" section for chest pain algorithms and additional  guidance. Performed at [facility], [place] [address] [place], [place] [address]   CK     Status: None   Collection Time: 10/30/2021  2:10 PM  Result Value Ref Range   Total CK 74 38 - 234 U/L    Comment: Performed at [facility], [place] [address], [place], [place] [address]  Lactic acid, plasma     Status: Abnormal   Collection Time: 10/29/2021  5:29 PM  Result Value Ref Range   Lactic Acid, Venous 5.4 (HH) 0.5 - 1.9 mmol/L    Comment: CRITICAL RESULT CALLED TO, READ BACK BY AND VERIFIED WITH: Toney Sang 1918 11/04/2021 WBOND Performed at [facility], [place] [address], [place], [place] [address]   Lipase, blood     Status: None   Collection Time: 11/12/2021  6:13 PM  Result Value Ref Range   Lipase 39 11 - 51 U/L    Comment: Performed at [facility] [address], [place], [place] [address]  Lactic acid, plasma     Status: Abnormal   Collection Time: 10/24/2021  6:41 PM  Result Value Ref Range   Lactic Acid, Venous 4.5 (HH) 0.5 - 1.9 mmol/L    Comment: CRITICAL VALUE NOTED.  VALUE IS CONSISTENT WITH PREVIOUSLY REPORTED AND CALLED VALUE. Performed at [facility], [place] [address], [place], [place] [address]   Troponin I (High Sensitivity)     Status: None   Collection Time: 11/17/2021  6:41 PM   Result Value Ref Range   Troponin I (High Sensitivity) 12 <18 ng/L    Comment: (NOTE) Elevated high sensitivity troponin I (hsTnI) values and significant  changes across serial measurements may suggest ACS but many other  chronic and acute conditions are known to elevate hsTnI results.  Refer to the "Links" section for chest pain algorithms and additional  guidance. Performed at [facility], [place] [address], [place], [place] [address]   Urinalysis, Routine w reflex microscopic Urine, Catheterized     Status: Abnormal   Collection Time: 10/30/2021  8:52 PM  Result Value Ref Range   Color, Urine AMBER (A) YELLOW    Comment: BIOCHEMICALS MAY BE AFFECTED BY COLOR   APPearance CLOUDY (A) CLEAR   Specific Gravity, Urine 1.018 1.005 - 1.030   pH 7.0 5.0 - 8.0   Glucose, UA NEGATIVE NEGATIVE mg/dL   Hgb urine dipstick NEGATIVE NEGATIVE   Bilirubin Urine NEGATIVE NEGATIVE   Ketones, ur NEGATIVE NEGATIVE mg/dL   Protein, ur NEGATIVE NEGATIVE mg/dL   Nitrite NEGATIVE NEGATIVE   Leukocytes,Ua TRACE (A) NEGATIVE   RBC / HPF 0-5 0 - 5 RBC/hpf   WBC, UA 11-20 0 - 5 WBC/hpf   Bacteria, UA FEW (A) NONE SEEN   Squamous Epithelial / LPF 0-5 0 - 5   Mucus PRESENT    Hyaline Casts, UA PRESENT     Comment: Performed at [facility], [address], [place], [place] [address]  Resp Panel by RT-PCR (Flu A&B, Covid) Nasopharyngeal Swab     Status: None   Collection Time: 10/31/21  8:58 PM   Specimen: Nasopharyngeal Swab; Nasopharyngeal(NP) swabs in vial transport medium  Result Value Ref Range   SARS Coronavirus 2 by RT PCR NEGATIVE NEGATIVE    Comment: (NOTE) SARS-CoV-2 target nucleic  acids are NOT DETECTED.  The SARS-CoV-2 RNA is generally detectable in upper respiratory specimens during the acute phase of infection. The lowest concentration of SARS-CoV-2 viral copies this assay can detect is 138 copies/mL. A negative result does not preclude SARS-Cov-2 infection and should not be  used as the sole basis for treatment or other patient management decisions. A negative result may occur with  improper specimen collection/handling, submission of specimen other than nasopharyngeal swab, presence of viral mutation(s) within the areas targeted by this assay, and inadequate number of viral copies(<138 copies/mL). A negative result must be combined with clinical observations, patient history, and epidemiological information. The expected result is Negative.  Fact Sheet for Patients:  EntrepreneurPulse.com.au  Fact Sheet for Healthcare Providers:  IncredibleEmployment.be  This test is no t yet approved or cleared by the [place] FDA and  has been authorized for detection and/or diagnosis of SARS-CoV-2 by FDA under an Emergency Use Authorization (EUA). This EUA will remain  in effect (meaning this test can be used) for the duration of the COVID-19 declaration under Section 564(b)(1) of the Act, 21 U.S.C.section 360bbb-3(b)(1), unless the authorization is terminated  or revoked sooner.       Influenza A by PCR NEGATIVE NEGATIVE   Influenza B by PCR NEGATIVE NEGATIVE    Comment: (NOTE) The Xpert Xpress SARS-CoV-2/FLU/RSV plus assay is intended as an aid in the diagnosis of influenza from Nasopharyngeal swab specimens and should not be used as a sole basis for treatment. Nasal washings and aspirates are unacceptable for Xpert Xpress SARS-CoV-2/FLU/RSV testing.  Fact Sheet for Patients: EntrepreneurPulse.com.au  Fact Sheet for Healthcare Providers: IncredibleEmployment.be  This test is not yet approved or cleared by the [place] FDA and has been authorized for detection and/or diagnosis of SARS-CoV-2 by FDA under an Emergency Use Authorization (EUA). This EUA will remain in effect (meaning this test can be used) for the duration of the COVID-19 declaration under Section 564(b)(1) of the  Act, 21 U.S.C. section 360bbb-3(b)(1), unless the authorization is terminated or revoked.  Performed at [facility], [place] [address], [place], [place] [address]     CT ABDOMEN PELVIS WO CONTRAST  Result Date: 11/05/2021 CLINICAL DATA:  Abdominal pain EXAM: CT ABDOMEN AND PELVIS WITHOUT CONTRAST TECHNIQUE: Multidetector CT imaging of the abdomen and pelvis was performed following the standard protocol without IV contrast. COMPARISON:  None. FINDINGS: Lower chest: Lung bases demonstrate trace left effusion. No acute consolidation. Normal cardiac size. Small moderate hiatal hernia. Hepatobiliary: No calcified gallstone. No biliary dilatation. No focal hepatic abnormality. Pancreas: No ductal dilatation. Generalized edema in the mesentery with abdominopelvic ascites. Spleen: Normal in size without focal abnormality. Adrenals/Urinary Tract: Adrenal glands are normal. Cyst upper pole left kidney. No hydronephrosis. Slight anterior positioning of left kidney. The bladder is distended but otherwise unremarkable. Stomach/Bowel: The stomach is nonenlarged. No dilated small bowel. Right groin hernia contains the cecum which is slightly air distended and contains some stool. There is no dilated bowel upstream to this. Large amount of stool in the transverse and descending colon. Possible mild wall thickening and stranding at the splenic flexure and proximal descending colon Vascular/Lymphatic: Advanced aortic atherosclerosis. No aneurysm. No suspicious nodes Reproductive: Not confidently visualized and is either atrophic or surgically absent. There is no adnexal mass lesion Other: No free air. Small volume of abdominal ascites with moderate ascites in the pelvis. Generalized subcutaneous edema. Large right groin hernia contains the cecum and mesentery. There is fluid within the hernia  sac and mild stranding. Musculoskeletal: Degenerative changes. No acute osseous abnormality. IMPRESSION: 1. Large right groin  hernia contains the cecum and ileocecal junction. The cecum in the hernia sac is mildly distended. There is fluid in the hernia sac and generalized stranding in the right groin but uncertain if this is due to incarceration versus generalized body edema as well as the presence of ascites within the abdomen and pelvis. There is no bowel obstruction upstream to this and there is stool present within the colon distal to the hernia 2. Large feces at the splenic flexure and proximal descending colon with slight colon distension. There may be slight wall thickening and surrounding stranding which could be seen with stercoral colitis. 3. Small abdominal ascites and moderate pelvic ascites. Generalized mild subcutaneous edema Electronically Signed   By: Donavan Foil M.D.   On: 11/08/2021 19:37   CT Head Wo Contrast  Result Date: 11/17/2021 CLINICAL DATA:  Head trauma EXAM: CT HEAD WITHOUT CONTRAST TECHNIQUE: Contiguous axial images were obtained from the base of the skull through the vertex without intravenous contrast. COMPARISON:  None. FINDINGS: Brain: No acute intracranial hemorrhage, mass effect, or herniation. No extra-axial fluid collections. No evidence of acute territorial infarct. No hydrocephalus. Mild cortical volume loss. Patchy hypodensities in the periventricular and subcortical white matter, likely secondary to chronic microvascular ischemic changes. Vascular: Calcified plaques in the carotid siphons. Skull: Hyperostosis frontalis interna. No acute skull fracture identified. Sinuses/Orbits: No acute finding. Other: None. IMPRESSION: Chronic changes with no acute intracranial process identified. Electronically Signed   By: Ofilia Neas M.D.   On: 10/30/2021 15:45   DG Chest Portable 1 View  Result Date: 11/03/2021 CLINICAL DATA:  Chest pain and weakness EXAM: PORTABLE CHEST 1 VIEW COMPARISON:  None. FINDINGS: Numerous leads and wires project over the chest. Patient rotated right. Normal heart  size. Tortuous thoracic aorta. No pleural effusion or pneumothorax. Mild subsegmental atelectasis or scarring at both lung bases. No lobar consolidation. IMPRESSION: No acute cardiopulmonary disease. Electronically Signed   By: Abigail Miyamoto M.D.   On: 10/26/2021 19:07    Imaging: Personally reviewed  A/P: Allison Mills is an 87 y.o. female with  Large right inguinal hernia- probably chronic Large stool burden L Stercoral colitis Acute Kidney Injury H/o HTN Elevated lactate Abdominopelvic ascites  She is resting comfortably.  She is nontoxic-appearing.  Her inguinal hernia is reducible.  Has what seems to be a large amount of air within the cecum on exam.  It herniates shortly after each reduction but she is completely nontender.  I would imagine this is a chronic right inguinal hernia, & probably chronically incarcerated/unchanged from its baseline.  There is no proximal dilatation of bowel on her CT.  She does have a large amount of stool burden in the transverse and proximal colon and I do think there is some wall inflammation in the proximal descending colon consistent with stercoral colitis.  Etiology of the ascites is unclear.  Her hemoglobin is elevated compared to prior ones and records I think her white count could actually be reactive and due to hemoconcentration  I do not think her (highly probable chronic) inguinal hernia is the etiology of her acute change; nonetheless, I do not think she needs urgent surgical intervention on it since it is soft, nontender, reducible, lactate is improving.  Recommend admission to medical service IV antibiotic to cover colitis Suppository and/or enema to try to evacuate her distal colon Gentle IV fluids Repeat labs in the  morning-CBC and bmet  I have a long conversation the family at the bedside  Leighton Ruff. Redmond Pulling, MD, FACS General, Bariatric, & Minimally Invasive Surgery [facility], [place]

## 2021-10-31 NOTE — Progress Notes (Signed)
Pharmacy Antibiotic Note  Allison Mills is a 87 y.o. female admitted on 11-16-21 with  intra-abdominal infection . On admit, WBC 12.9 and afebrile. Scr 1.42 (baseline ~0.8). Pharmacy has been consulted for Piperacillin-tazobactam dosing.  Plan: Piperacillin-tazobactam 2.25 gm q8h  F/u renal function and adjust as needed  Weight: 68 kg (149 lb 14.6 oz)  Temp (24hrs), Avg:97.6 F (36.4 C), Min:97.6 F (36.4 C), Max:97.6 F (36.4 C)  Recent Labs  Lab 2021-11-16 1349 Nov 16, 2021 1729  WBC 12.9*  --   CREATININE 1.42*  --   LATICACIDVEN  --  5.4*    Estimated Creatinine Clearance: 18 mL/min (A) (by C-G formula based on SCr of 1.42 mg/dL (H)).    No Known Allergies  Antimicrobials this admission: Zosyn 11/10 >>  Microbiology results: 11/10 COVID/FLU: negative  Thank you for allowing pharmacy to be a part of this patient's care.  Marja Kays 11/16/2021 10:24 PM

## 2021-10-31 NOTE — ED Triage Notes (Signed)
Pt bib [facility] from [facility]. Pt has a fall this am. Slid out of bed and onto the floor. LOC, did not hit head. Her caregiver moved her to a recliner. Denies  Pt caregiver reports seizure like activity. Reports slight rt side gaze, and some shaking. This went on for 5 mins. No hx of seizures. Caregiver says pt was slurring her speech. Family reports that her speech is at baseline and sounds normal. Upon arrival pt is speaking clearly. Pt is A&Ox4 upon arrival. Vital signs stable. Pt remembers all the events that occurred this morning, does not remember the seizure like activity the caregiver reported. Pt does not take blood thinners.

## 2021-10-31 NOTE — ED Notes (Signed)
To CT

## 2021-10-31 NOTE — ED Notes (Signed)
Called lab to have CK and troponin added onto previous collection. Per lab, they will add CK and troponin to previous collection.

## 2021-10-31 NOTE — ED Provider Notes (Signed)
  Physical Exam  BP 129/79   Pulse 65   Temp 97.6 F (36.4 C) (Temporal)   Resp 15   Wt 68 kg   SpO2 97%   BMI 24.95 kg/m   Physical Exam  ED Course/Procedures     .Critical Care Performed by: Alvira Monday, MD Authorized by: Alvira Monday, MD   Critical care provider statement:    Critical care time (minutes):  30   Critical care was time spent personally by me on the following activities:  Discussions with consultants, evaluation of patient's response to treatment, examination of patient, ordering and review of laboratory studies and ordering and review of radiographic studies  MDM  Received care of patient at 3:30 PM.  Please see previous note for history, physical and exam.  Briefly, this is a 87 year old female who lives in independent living who was found sitting on the floor today, reporting that she felt too weak to go to breakfast.  Labs so far show AKI. CT head and urinalysis are pending.  While being in the ED, she developed nausea and vomiting. Reports to me she did have some LUQ abdominal pain but does not have tenderness on exam. Added troponin, lactic acid, CK.   Had another episode of emesis, following this had BP 82, other BP had been normal, recheck was improved, possible vagal with emesis. CT abdomen pelvis ordered.  CT shows large right inguinal hernia with some stranding, ascites, generalized subcutaneous edema, and findings consistent with stercoral colitis.  Lactic acid significantly elevated above 5-there had been question of foaming at the mouth earlier per facility-?seizure, given findings of colitis and stranding surrounding hernia question this etiology although she has no abdominal pain or tenderness on reevaluation, no clear source of sepsis-possible UTI, possible intraabdominal, likely dehydration contributing to both lactic acidosis and AKI.  Given IV fluids.  General Surgery, Dr. Andrey Campanile consulted and evaluated patient and spoke to daughter at  bedside.  Will continue to monitor, do not see need for emergent surgery, recommend suppository, iv hydration, empiric abx.   Consulted hospitalist for admission.           Alvira Monday, MD 11/15/2021 [phone]

## 2021-10-31 NOTE — ED Notes (Signed)
RN aware of pt BP 

## 2021-11-01 ENCOUNTER — Other Ambulatory Visit: Payer: Self-pay

## 2021-11-01 ENCOUNTER — Encounter (HOSPITAL_COMMUNITY): Payer: Self-pay | Admitting: Internal Medicine

## 2021-11-01 DIAGNOSIS — N179 Acute kidney failure, unspecified: Secondary | ICD-10-CM

## 2021-11-01 DIAGNOSIS — Z66 Do not resuscitate: Secondary | ICD-10-CM | POA: Diagnosis present

## 2021-11-01 DIAGNOSIS — R410 Disorientation, unspecified: Secondary | ICD-10-CM | POA: Diagnosis present

## 2021-11-01 DIAGNOSIS — I959 Hypotension, unspecified: Secondary | ICD-10-CM | POA: Diagnosis present

## 2021-11-01 DIAGNOSIS — Z20822 Contact with and (suspected) exposure to covid-19: Secondary | ICD-10-CM | POA: Diagnosis present

## 2021-11-01 DIAGNOSIS — K5289 Other specified noninfective gastroenteritis and colitis: Secondary | ICD-10-CM

## 2021-11-01 DIAGNOSIS — E872 Acidosis, unspecified: Secondary | ICD-10-CM | POA: Diagnosis present

## 2021-11-01 DIAGNOSIS — I452 Bifascicular block: Secondary | ICD-10-CM | POA: Diagnosis present

## 2021-11-01 DIAGNOSIS — H409 Unspecified glaucoma: Secondary | ICD-10-CM | POA: Diagnosis present

## 2021-11-01 DIAGNOSIS — N182 Chronic kidney disease, stage 2 (mild): Secondary | ICD-10-CM | POA: Diagnosis present

## 2021-11-01 DIAGNOSIS — K5900 Constipation, unspecified: Secondary | ICD-10-CM

## 2021-11-01 DIAGNOSIS — I2510 Atherosclerotic heart disease of native coronary artery without angina pectoris: Secondary | ICD-10-CM | POA: Diagnosis present

## 2021-11-01 DIAGNOSIS — I129 Hypertensive chronic kidney disease with stage 1 through stage 4 chronic kidney disease, or unspecified chronic kidney disease: Secondary | ICD-10-CM | POA: Diagnosis present

## 2021-11-01 DIAGNOSIS — Z79899 Other long term (current) drug therapy: Secondary | ICD-10-CM | POA: Diagnosis not present

## 2021-11-01 DIAGNOSIS — D72829 Elevated white blood cell count, unspecified: Secondary | ICD-10-CM

## 2021-11-01 DIAGNOSIS — E1122 Type 2 diabetes mellitus with diabetic chronic kidney disease: Secondary | ICD-10-CM | POA: Diagnosis present

## 2021-11-01 DIAGNOSIS — K4090 Unilateral inguinal hernia, without obstruction or gangrene, not specified as recurrent: Secondary | ICD-10-CM | POA: Diagnosis not present

## 2021-11-01 DIAGNOSIS — R739 Hyperglycemia, unspecified: Secondary | ICD-10-CM

## 2021-11-01 DIAGNOSIS — E860 Dehydration: Secondary | ICD-10-CM | POA: Diagnosis present

## 2021-11-01 DIAGNOSIS — E1165 Type 2 diabetes mellitus with hyperglycemia: Secondary | ICD-10-CM | POA: Diagnosis present

## 2021-11-01 DIAGNOSIS — R188 Other ascites: Secondary | ICD-10-CM | POA: Diagnosis present

## 2021-11-01 DIAGNOSIS — Z8049 Family history of malignant neoplasm of other genital organs: Secondary | ICD-10-CM | POA: Diagnosis not present

## 2021-11-01 LAB — COMPREHENSIVE METABOLIC PANEL
ALT: 14 U/L (ref 0–44)
AST: 27 U/L (ref 15–41)
Albumin: 2.5 g/dL — ABNORMAL LOW (ref 3.5–5.0)
Alkaline Phosphatase: 36 U/L — ABNORMAL LOW (ref 38–126)
Anion gap: 13 (ref 5–15)
BUN: 34 mg/dL — ABNORMAL HIGH (ref 8–23)
CO2: 19 mmol/L — ABNORMAL LOW (ref 22–32)
Calcium: 8.9 mg/dL (ref 8.9–10.3)
Chloride: 104 mmol/L (ref 98–111)
Creatinine, Ser: 1.58 mg/dL — ABNORMAL HIGH (ref 0.44–1.00)
GFR, Estimated: 29 mL/min — ABNORMAL LOW (ref >60)
Glucose, Bld: 226 mg/dL — ABNORMAL HIGH (ref 70–99)
Potassium: 3.8 mmol/L (ref 3.5–5.1)
Sodium: 136 mmol/L (ref 135–145)
Total Bilirubin: 1.1 mg/dL (ref 0.3–1.2)
Total Protein: 5.1 g/dL — ABNORMAL LOW (ref 6.5–8.1)

## 2021-11-01 LAB — CBC
HCT: 54.6 % — ABNORMAL HIGH (ref 36.0–46.0)
HCT: 54.4 % — ABNORMAL HIGH (ref 36.0–46.0)
Hemoglobin: 18.2 g/dL — ABNORMAL HIGH (ref 12.0–15.0)
Hemoglobin: 18.1 g/dL — ABNORMAL HIGH (ref 12.0–15.0)
MCH: 31 pg (ref 26.0–34.0)
MCH: 30.7 pg (ref 26.0–34.0)
MCHC: 33.3 g/dL (ref 30.0–36.0)
MCHC: 33.3 g/dL (ref 30.0–36.0)
MCV: 93 fL (ref 80.0–100.0)
MCV: 92.4 fL (ref 80.0–100.0)
Platelets: 182 10*3/uL (ref 150–400)
Platelets: 177 10*3/uL (ref 150–400)
RBC: 5.87 MIL/uL — ABNORMAL HIGH (ref 3.87–5.11)
RBC: 5.89 MIL/uL — ABNORMAL HIGH (ref 3.87–5.11)
RDW: 13 % (ref 11.5–15.5)
RDW: 13.1 % (ref 11.5–15.5)
WBC: 18.2 10*3/uL — ABNORMAL HIGH (ref 4.0–10.5)
WBC: 20.8 10*3/uL — ABNORMAL HIGH (ref 4.0–10.5)
nRBC: 0 % (ref 0.0–0.2)
nRBC: 0 % (ref 0.0–0.2)

## 2021-11-01 LAB — LACTIC ACID, PLASMA
Lactic Acid, Venous: 6.1 mmol/L (ref 0.5–1.9)
Lactic Acid, Venous: 7.4 mmol/L (ref 0.5–1.9)
Lactic Acid, Venous: 8.6 mmol/L (ref 0.5–1.9)

## 2021-11-01 LAB — MAGNESIUM: Magnesium: 2.2 mg/dL (ref 1.7–2.4)

## 2021-11-01 LAB — HEMOGLOBIN A1C
Hgb A1c MFr Bld: 5.6 % (ref 4.8–5.6)
Mean Plasma Glucose: 114.02 mg/dL

## 2021-11-01 LAB — PHOSPHORUS: Phosphorus: 4.7 mg/dL — ABNORMAL HIGH (ref 2.5–4.6)

## 2021-11-01 MED ORDER — SODIUM CHLORIDE 0.9 % IV SOLN: INTRAVENOUS | Status: DC

## 2021-11-01 MED ORDER — POLYETHYLENE GLYCOL 3350 17 G PO PACK
17.0000 g | PACK | Freq: Every day | ORAL | Status: DC
  Filled 2021-11-01: qty 1

## 2021-11-01 MED ORDER — ENOXAPARIN SODIUM 30 MG/0.3ML IJ SOSY
30.0000 mg | PREFILLED_SYRINGE | INTRAMUSCULAR | Status: DC
  Administered 2021-11-01: 30 mg via SUBCUTANEOUS
  Filled 2021-11-01: qty 0.3

## 2021-11-01 MED ORDER — ACETAMINOPHEN 325 MG PO TABS: 650.0000 mg | ORAL_TABLET | Freq: Four times a day (QID) | ORAL | Status: DC | PRN

## 2021-11-01 MED ORDER — BISACODYL 10 MG RE SUPP
10.0000 mg | Freq: Once | RECTAL | Status: AC
  Administered 2021-11-01: 10 mg via RECTAL
  Filled 2021-11-01: qty 1

## 2021-11-01 MED ORDER — ACETAMINOPHEN 650 MG RE SUPP: 650.0000 mg | Freq: Four times a day (QID) | RECTAL | Status: DC | PRN

## 2021-11-01 MED ORDER — BISACODYL 10 MG RE SUPP: 10.0000 mg | Freq: Every day | RECTAL | Status: DC | PRN

## 2021-11-21 NOTE — Progress Notes (Signed)
Progress Note     Subjective: Confused and agitated this am. She does verbally respond to questions but speech is overall not discernable which may be partially due to lack of dentures. She does say "no" when asked if she hurts anywhere. She does not follow commands.  Objective: Vital signs in last 24 hours: Temp:  [97.3 F (36.3 C)-98.1 F (36.7 C)] 97.3 F (36.3 C) (11/11 0756) Pulse Rate:  [35-87] 58 (11/11 0756) Resp:  [11-21] 16 (11/11 0756) BP: (73-162)/(40-99) 81/60 (11/11 0756) SpO2:  [83 %-100 %] 83 % (11/11 0756) Weight:  [64.6 kg-68 kg] 64.6 kg (11/11 0032)    Intake/Output from previous day: 11/10 0701 - 11/11 0700 In: 2252.1 [I.V.:252.1; IV Piggyback:2000] Out: -  Intake/Output this shift: No intake/output data recorded.  PE: General: agitated, WD, female who is laying in bed in NAD HEENT: head is normocephalic, atraumatic.  Mouth is pink and moist. Edentulous  Heart: regular, rate, and rhythm. Palpable radial pulses bilaterally Lungs: CTAB, no wheezes, rhonchi, or rales noted.  Respiratory effort nonlabored on supplemental O2 via [place] Abd: soft, +BS, no masses, hernias, or organomegaly. Nontender on exam. Large, soft, reducible right inguinal hernia that recurs shortly after reduction. Tolerates abdominal exam and reduction without any verbal or facial response to suggest discomfort MSK: all 4 extremities are symmetrical with no cyanosis, clubbing, or edema. Skin: warm and dry with no masses, lesions, or rashes Psych: agitated and confused. Does not follow commands   Lab Results:  Recent Labs    11/02/21 0130 02-Nov-2021 0359  WBC 18.2* 20.8*  HGB 18.2* 18.1*  HCT 54.6* 54.4*  PLT 182 177   BMET Recent Labs    10/26/2021 1349 11-02-21 0130  NA 138 136  K 4.3 3.8  CL 103 104  CO2 25 19*  GLUCOSE 187* 226*  BUN 30* 34*  CREATININE 1.42* 1.58*  CALCIUM 9.5 8.9   PT/INR No results for input(s): LABPROT, INR in the last 72 hours. CMP      Component Value Date/Time   NA 136 02-Nov-2021 0130   NA 136 05/27/2021 1253   K 3.8 02-Nov-2021 0130   CL 104 2021-11-02 0130   CO2 19 (L) 11/02/2021 0130   GLUCOSE 226 (H) 02-Nov-2021 0130   BUN 34 (H) 11/02/21 0130   BUN 17 05/27/2021 1253   CREATININE 1.58 (H) 02-Nov-2021 0130   CALCIUM 8.9 11/02/2021 0130   PROT 5.1 (L) 11/02/21 0130   PROT 6.8 05/27/2021 1253   ALBUMIN 2.5 (L) 11/02/21 0130   ALBUMIN 4.0 05/27/2021 1253   AST 27 November 02, 2021 0130   ALT 14 11/02/2021 0130   ALKPHOS 36 (L) 02-Nov-2021 0130   BILITOT 1.1 2021/11/02 0130   BILITOT 0.4 05/27/2021 1253   GFRNONAA 29 (L) 02-Nov-2021 0130   GFRAA 83 11/28/2020 1603   Lipase     Component Value Date/Time   LIPASE 39 10/23/2021 1813       Studies/Results: CT ABDOMEN PELVIS WO CONTRAST  Result Date: 11/03/2021 CLINICAL DATA:  Abdominal pain EXAM: CT ABDOMEN AND PELVIS WITHOUT CONTRAST TECHNIQUE: Multidetector CT imaging of the abdomen and pelvis was performed following the standard protocol without IV contrast. COMPARISON:  None. FINDINGS: Lower chest: Lung bases demonstrate trace left effusion. No acute consolidation. Normal cardiac size. Small moderate hiatal hernia. Hepatobiliary: No calcified gallstone. No biliary dilatation. No focal hepatic abnormality. Pancreas: No ductal dilatation. Generalized edema in the mesentery with abdominopelvic ascites. Spleen: Normal in size without focal abnormality. Adrenals/Urinary Tract:  Adrenal glands are normal. Cyst upper pole left kidney. No hydronephrosis. Slight anterior positioning of left kidney. The bladder is distended but otherwise unremarkable. Stomach/Bowel: The stomach is nonenlarged. No dilated small bowel. Right groin hernia contains the cecum which is slightly air distended and contains some stool. There is no dilated bowel upstream to this. Large amount of stool in the transverse and descending colon. Possible mild wall thickening and stranding at the splenic  flexure and proximal descending colon Vascular/Lymphatic: Advanced aortic atherosclerosis. No aneurysm. No suspicious nodes Reproductive: Not confidently visualized and is either atrophic or surgically absent. There is no adnexal mass lesion Other: No free air. Small volume of abdominal ascites with moderate ascites in the pelvis. Generalized subcutaneous edema. Large right groin hernia contains the cecum and mesentery. There is fluid within the hernia sac and mild stranding. Musculoskeletal: Degenerative changes. No acute osseous abnormality. IMPRESSION: 1. Large right groin hernia contains the cecum and ileocecal junction. The cecum in the hernia sac is mildly distended. There is fluid in the hernia sac and generalized stranding in the right groin but uncertain if this is due to incarceration versus generalized body edema as well as the presence of ascites within the abdomen and pelvis. There is no bowel obstruction upstream to this and there is stool present within the colon distal to the hernia 2. Large feces at the splenic flexure and proximal descending colon with slight colon distension. There may be slight wall thickening and surrounding stranding which could be seen with stercoral colitis. 3. Small abdominal ascites and moderate pelvic ascites. Generalized mild subcutaneous edema Electronically Signed   By: Jasmine PangKim  Fujinaga M.D.   On: 11/09/2021 19:37   CT Head Wo Contrast  Result Date: 11/14/2021 CLINICAL DATA:  Head trauma EXAM: CT HEAD WITHOUT CONTRAST TECHNIQUE: Contiguous axial images were obtained from the base of the skull through the vertex without intravenous contrast. COMPARISON:  None. FINDINGS: Brain: No acute intracranial hemorrhage, mass effect, or herniation. No extra-axial fluid collections. No evidence of acute territorial infarct. No hydrocephalus. Mild cortical volume loss. Patchy hypodensities in the periventricular and subcortical white matter, likely secondary to chronic  microvascular ischemic changes. Vascular: Calcified plaques in the carotid siphons. Skull: Hyperostosis frontalis interna. No acute skull fracture identified. Sinuses/Orbits: No acute finding. Other: None. IMPRESSION: Chronic changes with no acute intracranial process identified. Electronically Signed   By: Jannifer Hickelaney  Williams M.D.   On: 10/22/2021 15:45   DG Chest Portable 1 View  Result Date: 11/04/2021 CLINICAL DATA:  Chest pain and weakness EXAM: PORTABLE CHEST 1 VIEW COMPARISON:  None. FINDINGS: Numerous leads and wires project over the chest. Patient rotated right. Normal heart size. Tortuous thoracic aorta. No pleural effusion or pneumothorax. Mild subsegmental atelectasis or scarring at both lung bases. No lobar consolidation. IMPRESSION: No acute cardiopulmonary disease. Electronically Signed   By: Jeronimo GreavesKyle  Talbot M.D.   On: 11/09/2021 19:07    Anti-infectives: Anti-infectives (From admission, onward)    Start     Dose/Rate Route Frequency Ordered Stop   10/27/2021 2230  piperacillin-tazobactam (ZOSYN) IVPB 2.25 g        2.25 g 100 mL/hr over 30 Minutes Intravenous Every 8 hours 11/16/2021 2222          Assessment/Plan Large right inguinal hernia- probably chronic Large stool burden L Stercoral colitis - hernia again reduces on exam this am, appears chronic, and on review of CT scan there is no bowel obstruction. Abdominal exam is benign. Do not think hernia is etiology  of her acute problems - no acute surgical intervention indicated at this time and given her overall health she is not a good surgical candidate   We will sign off but please do not hesitate to contact us with any questions or concerns or reconsult if needed.     LOS: 0 days   Acute Kidney Injury H/o HTN Elevated lactate Abdominopelvic ascites    Eric Form, [facility] November 07, 2021, 10:15 AM Please see Amion for pager number during day hours 7:00am-4:30pm

## 2021-11-21 NOTE — Discharge Summary (Signed)
Physician Discharge Summary  [name] WLS:[id number] DOB: 12/02/1916 DOA: 11/18/2021  PCP: Dorothyann Peng, MD  Admit date: 2021-11-18 Date and time of death: 11:33 am Nov 19, 2021  Recommendations for Outpatient Follow-up:  Patient is deceased.  Discharge Diagnoses: Principal diagnosis is #1 Stercoral Colitis Right inguinal hernia Leukocytosis AKI Hyperglycemia Lactic acidosis Dehydration Constipation  Discharge Condition: Deceased   Filed Weights   11-18-2021 2200 Nov 19, 2021 0032 2021-11-19 1126  Weight: 68 kg 64.6 kg 64.6 kg    History of present illness:  Allison Mills is a 87 y.o. female with medical history significant for hypertension, CAD, glaucoma who presents to the emergency department via EMS due to reported fall sustained at the living facility PTA. Patient was unable to provide history, this was obtained from ED physician and daughter at bedside.  Per report, patient lives at an independent senior living facility and has an aide that assists her during the day and 1 in the evening.  Patient was noted not to show up for breakfast at the facility this morning, so the staff checked on her and she was noted to be sitting on the floor by the bedside, when asked if she fell patient states that she went to use the restroom and then came back to sit on the bed (however, patient was noted to be sitting on the floor), she denies any head injury.  Per ED medical record, patient's caregiver reported that patient seemed confused since last 2 days and was weaker than usual.  There was concern for UTI since patient's presentation was similar to last time she was diagnosed to have UTI.  There was also concern for possible seizure due to some "frothing at the mouth", but there was no reported tongue bite, incontinence, shaking, loss of consciousness.  EMS was activated and patient was taken to the ED for further evaluation and management.  No reported seizure-like activity by EMS team.   Patient denies any burning sensation on urination or any other irritative bladder symptoms.   ED Course:  In the emergency department, she was bradycardic, but other vital signs were within normal range.  Work-up in the ED showed leukocytosis, hyperglycemia, BUN/creatinine 30/1.42 (baseline creatinine is 0.7-0.8), troponin x2- 15 > 12, lipase 39, total CK 74, lactic acid 5.4 > 4.5.  Influenza A, B, SARS coronavirus 2 was negative. CT of head showed chronic changes with no acute intracranial process Chest x-ray showed no acute cardiopulmonary disease CT abdomen and pelvis without contrast showed Large right groin hernia contains the cecum and ileocecal junction. The cecum in the hernia sac is mildly distended. There is fluid in the hernia sac and generalized stranding in the right groin but uncertain if this is due to incarceration versus generalized body edema as well as the presence of ascites within the abdomen and pelvis. There is no bowel obstruction upstream to this and there is stool present within the colon distal to the hernia. Patient was reported to present with vomiting while in the ED, CT abdomen was suggestive of stercoral colitis.  General surgery was consulted due to large right inguinal hernia. Patient was started on IV Zosyn, IV hydration was provided and Dulcolax suppository was given.  Hospitalist was asked to admit patient for further evaluation and management.  Hospital Course:  The patient was admitted to a med/surg bed. She was given IV Zosyn, IV hydration, as well as a dulcolax suppository. The patient was found expired at 11:33 am.   Today's assessment: S: Please see  physical exam on H&P dated 10/25/2021 for last physical exam prior to patient's death. O: Vitals:  Vitals:   Nov 25, 2021 0756 11/25/2021 1000  BP: (!) 81/60 113/85  Pulse: (!) 58 89  Resp: 16   Temp: (!) 97.3 F (36.3 C)   SpO2: (!) 83% 94%      Allergies as of 11/25/2021   No Known Allergies       Medication List     ASK your doctor about these medications    amLODipine 2.5 MG tablet Commonly known as: NORVASC TAKE 1 TABLET(2.5 MG) BY MOUTH DAILY   brimonidine 0.1 % Soln Commonly known as: ALPHAGAN P Place 1 drop into both eyes 2 (two) times daily.   brinzolamide 1 % ophthalmic suspension Commonly known as: AZOPT Place 1 drop into both eyes 2 (two) times daily.   dorzolamide 2 % ophthalmic solution Commonly known as: TRUSOPT Place 1 drop into both eyes 2 (two) times daily.   latanoprost 0.005 % ophthalmic solution Commonly known as: XALATAN Place 1 drop into both eyes at bedtime.   Lumigan 0.01 % Soln Generic drug: bimatoprost Place 1 drop into both eyes at bedtime.   Magnesium 250 MG Tabs Take 1 tablet (250 mg total) by mouth daily.   valsartan-hydrochlorothiazide 160-12.5 MG tablet Commonly known as: DIOVAN-HCT Take 1 tablet by mouth daily.       No Known Allergies  The results of significant diagnostics from this hospitalization (including imaging, microbiology, ancillary and laboratory) are listed below for reference.    Significant Diagnostic Studies: CT ABDOMEN PELVIS WO CONTRAST  Result Date: 10/30/2021 CLINICAL DATA:  Abdominal pain EXAM: CT ABDOMEN AND PELVIS WITHOUT CONTRAST TECHNIQUE: Multidetector CT imaging of the abdomen and pelvis was performed following the standard protocol without IV contrast. COMPARISON:  None. FINDINGS: Lower chest: Lung bases demonstrate trace left effusion. No acute consolidation. Normal cardiac size. Small moderate hiatal hernia. Hepatobiliary: No calcified gallstone. No biliary dilatation. No focal hepatic abnormality. Pancreas: No ductal dilatation. Generalized edema in the mesentery with abdominopelvic ascites. Spleen: Normal in size without focal abnormality. Adrenals/Urinary Tract: Adrenal glands are normal. Cyst upper pole left kidney. No hydronephrosis. Slight anterior positioning of left kidney. The bladder is  distended but otherwise unremarkable. Stomach/Bowel: The stomach is nonenlarged. No dilated small bowel. Right groin hernia contains the cecum which is slightly air distended and contains some stool. There is no dilated bowel upstream to this. Large amount of stool in the transverse and descending colon. Possible mild wall thickening and stranding at the splenic flexure and proximal descending colon Vascular/Lymphatic: Advanced aortic atherosclerosis. No aneurysm. No suspicious nodes Reproductive: Not confidently visualized and is either atrophic or surgically absent. There is no adnexal mass lesion Other: No free air. Small volume of abdominal ascites with moderate ascites in the pelvis. Generalized subcutaneous edema. Large right groin hernia contains the cecum and mesentery. There is fluid within the hernia sac and mild stranding. Musculoskeletal: Degenerative changes. No acute osseous abnormality. IMPRESSION: 1. Large right groin hernia contains the cecum and ileocecal junction. The cecum in the hernia sac is mildly distended. There is fluid in the hernia sac and generalized stranding in the right groin but uncertain if this is due to incarceration versus generalized body edema as well as the presence of ascites within the abdomen and pelvis. There is no bowel obstruction upstream to this and there is stool present within the colon distal to the hernia 2. Large feces at the splenic flexure and proximal descending colon  with slight colon distension. There may be slight wall thickening and surrounding stranding which could be seen with stercoral colitis. 3. Small abdominal ascites and moderate pelvic ascites. Generalized mild subcutaneous edema Electronically Signed   By: Jasmine Pang M.D.   On: 11/29/2021 19:37   CT Head Wo Contrast  Result Date: November 29, 2021 CLINICAL DATA:  Head trauma EXAM: CT HEAD WITHOUT CONTRAST TECHNIQUE: Contiguous axial images were obtained from the base of the skull through the  vertex without intravenous contrast. COMPARISON:  None. FINDINGS: Brain: No acute intracranial hemorrhage, mass effect, or herniation. No extra-axial fluid collections. No evidence of acute territorial infarct. No hydrocephalus. Mild cortical volume loss. Patchy hypodensities in the periventricular and subcortical white matter, likely secondary to chronic microvascular ischemic changes. Vascular: Calcified plaques in the carotid siphons. Skull: Hyperostosis frontalis interna. No acute skull fracture identified. Sinuses/Orbits: No acute finding. Other: None. IMPRESSION: Chronic changes with no acute intracranial process identified. Electronically Signed   By: Jannifer Hick M.D.   On: 11/29/21 15:45   DG Chest Portable 1 View  Result Date: November 29, 2021 CLINICAL DATA:  Chest pain and weakness EXAM: PORTABLE CHEST 1 VIEW COMPARISON:  None. FINDINGS: Numerous leads and wires project over the chest. Patient rotated right. Normal heart size. Tortuous thoracic aorta. No pleural effusion or pneumothorax. Mild subsegmental atelectasis or scarring at both lung bases. No lobar consolidation. IMPRESSION: No acute cardiopulmonary disease. Electronically Signed   By: Jeronimo Greaves M.D.   On: 11/29/21 19:07    Microbiology: Recent Results (from the past 240 hour(s))  Resp Panel by RT-PCR (Flu A&B, Covid) Nasopharyngeal Swab     Status: None   Collection Time: 11-29-21  8:58 PM   Specimen: Nasopharyngeal Swab; Nasopharyngeal(NP) swabs in vial transport medium  Result Value Ref Range Status   SARS Coronavirus 2 by RT PCR NEGATIVE NEGATIVE Final    Comment: (NOTE) SARS-CoV-2 target nucleic acids are NOT DETECTED.  The SARS-CoV-2 RNA is generally detectable in upper respiratory specimens during the acute phase of infection. The lowest concentration of SARS-CoV-2 viral copies this assay can detect is 138 copies/mL. A negative result does not preclude SARS-Cov-2 infection and should not be used as the sole basis  for treatment or other patient management decisions. A negative result may occur with  improper specimen collection/handling, submission of specimen other than nasopharyngeal swab, presence of viral mutation(s) within the areas targeted by this assay, and inadequate number of viral copies(<138 copies/mL). A negative result must be combined with clinical observations, patient history, and epidemiological information. The expected result is Negative.  Fact Sheet for Patients:  BloggerCourse.com  Fact Sheet for Healthcare Providers:  SeriousBroker.it  This test is no t yet approved or cleared by the [place] FDA and  has been authorized for detection and/or diagnosis of SARS-CoV-2 by FDA under an Emergency Use Authorization (EUA). This EUA will remain  in effect (meaning this test can be used) for the duration of the COVID-19 declaration under Section 564(b)(1) of the Act, 21 U.S.C.section 360bbb-3(b)(1), unless the authorization is terminated  or revoked sooner.       Influenza A by PCR NEGATIVE NEGATIVE Final   Influenza B by PCR NEGATIVE NEGATIVE Final    Comment: (NOTE) The Xpert Xpress SARS-CoV-2/FLU/RSV plus assay is intended as an aid in the diagnosis of influenza from Nasopharyngeal swab specimens and should not be used as a sole basis for treatment. Nasal washings and aspirates are unacceptable for Xpert Xpress SARS-CoV-2/FLU/RSV testing.  Fact Sheet for  Patients: EntrepreneurPulse.com.au  Fact Sheet for Healthcare Providers: IncredibleEmployment.be  This test is not yet approved or cleared by the [place] FDA and has been authorized for detection and/or diagnosis of SARS-CoV-2 by FDA under an Emergency Use Authorization (EUA). This EUA will remain in effect (meaning this test can be used) for the duration of the COVID-19 declaration under Section 564(b)(1) of the Act, 21  U.S.C. section 360bbb-3(b)(1), unless the authorization is terminated or revoked.  Performed at [facility], [place] [address] [place], [place] [address]      Labs: Basic Metabolic Panel: Recent Labs  Lab 10/27/2021 1349 11/09/2021 0130  NA 138 136  K 4.3 3.8  CL 103 104  CO2 25 19*  GLUCOSE 187* 226*  BUN 30* 34*  CREATININE 1.42* 1.58*  CALCIUM 9.5 8.9  MG  --  2.2  PHOS  --  4.7*   Liver Function Tests: Recent Labs  Lab 11/17/2021 1349 11-09-2021 0130  AST 28 27  ALT 10 14  ALKPHOS 45 36*  BILITOT 1.1 1.1  PROT 5.9* 5.1*  ALBUMIN 3.0* 2.5*   Recent Labs  Lab 10/23/2021 1813  LIPASE 39   No results for input(s): AMMONIA in the last 168 hours. CBC: Recent Labs  Lab 11/07/2021 1349 2021/11/09 0130 2021-11-09 0359  WBC 12.9* 18.2* 20.8*  NEUTROABS 10.9*  --   --   HGB 15.5* 18.2* 18.1*  HCT 46.0 54.6* 54.4*  MCV 92.6 93.0 92.4  PLT 195 182 177   Cardiac Enzymes: Recent Labs  Lab 11/15/2021 1410  CKTOTAL 74   BNP: BNP (last 3 results) No results for input(s): BNP in the last 8760 hours.  ProBNP (last 3 results) No results for input(s): PROBNP in the last 8760 hours.  CBG: No results for input(s): GLUCAP in the last 168 hours.  Principal Problem:   Right inguinal hernia Active Problems:   Stercoral colitis   Leukocytosis   AKI (acute kidney injury) ([place])   Hyperglycemia   Lactic acidosis   Dehydration   Constipation   Time coordinating discharge: 38 minutes  Signed:        Madge Therrien, DO [facility]  11/05/2021, 5:27 PM

## 2021-11-21 NOTE — Progress Notes (Signed)
VAST RN responded to bedside for second assess for IV access. Upon arriving at room, NA stepped out and told RN she didn't like how patient looked. Upon entering room, patient with RR 10 and cold to touch, with no response to verbal commands. Pt DNR status. Stayed with patient and rubbed her head while talking to her. RN in process of calling physician and patient's daughter. During this time, patient took her last breath.

## 2021-11-21 NOTE — Progress Notes (Signed)
Patient arrived on 6N with daughter at bedside but left early. A&O but HOH. Denied pain or discomfort. Assessment done. Educated on POC. We continue to monitor.

## 2021-11-21 DEATH — deceased

## 2021-11-26 ENCOUNTER — Encounter: Payer: Medicare PPO | Admitting: Internal Medicine

## 2023-06-19 IMAGING — CT CT HEAD W/O CM
4 series · 16 of 47 positions shown, 18 images · non-contrast
Comparison: None.

CLINICAL DATA: Head trauma

EXAM:
CT HEAD WITHOUT CONTRAST
TECHNIQUE: Contiguous axial images were obtained from the base of the skull
through the vertex without intravenous contrast.

[Series 3: head without · axial · non-contrast · 0.44mm/px · z∈[-92,+32]mm · 7 of 35 slices shown, 9 images]
[im 5/35  brain]
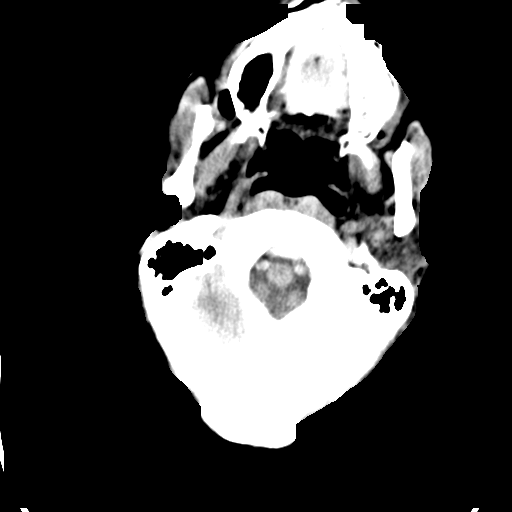
[im 5/35  bone]
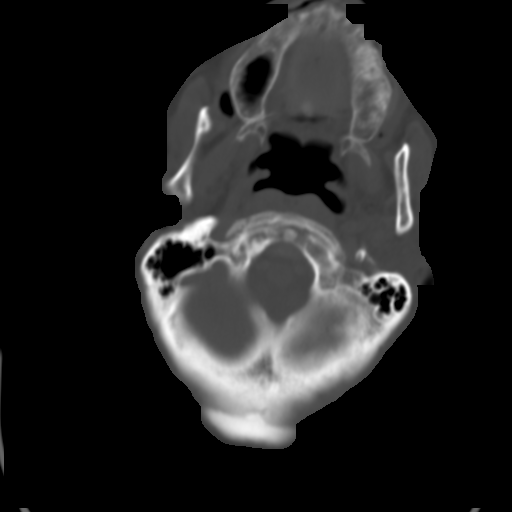
[im 9/35  brain]
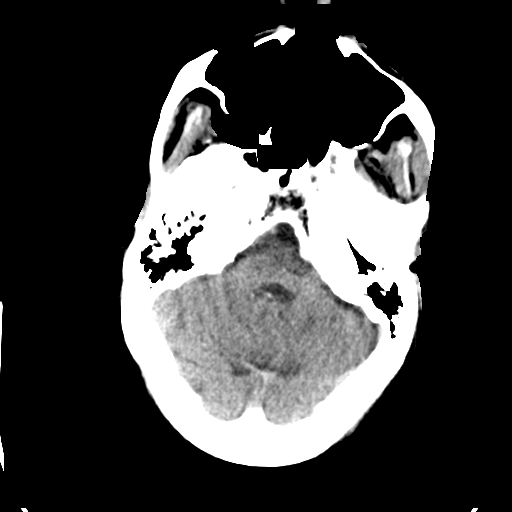
[im 13/35  brain]
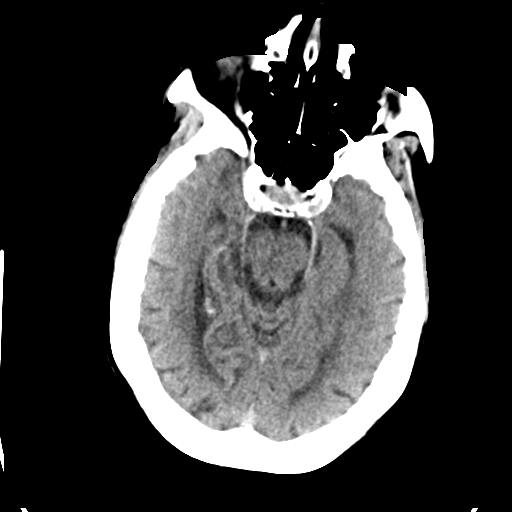
[im 18/35  brain]
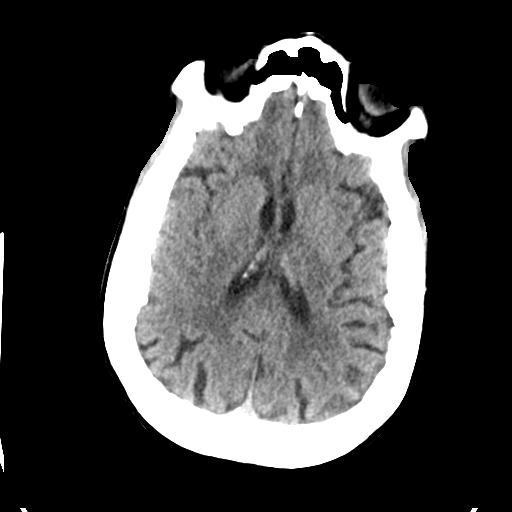
[im 22/35  brain]
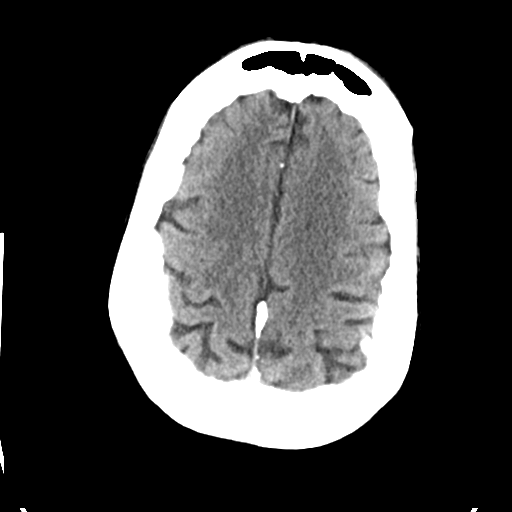
[im 22/35  bone]
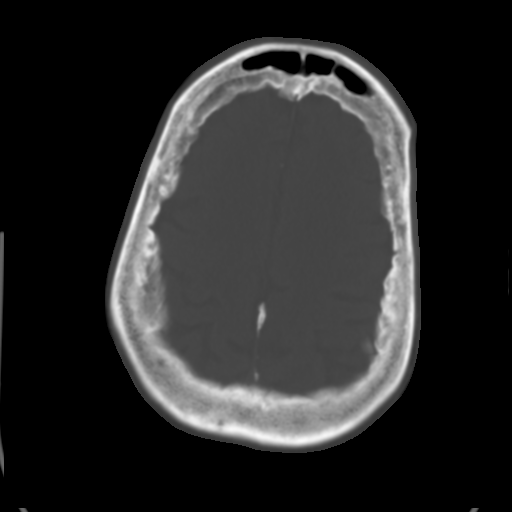
[im 26/35  brain]
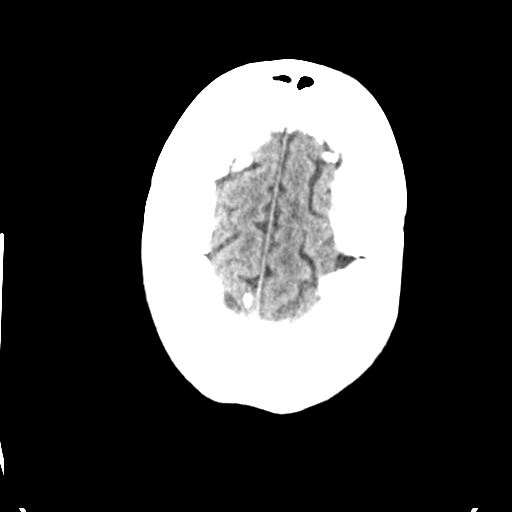
[im 30/35  brain]
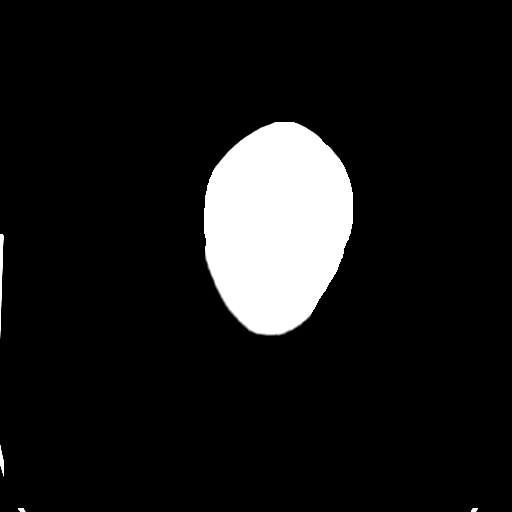

[Series 4: head bone · axial · 0.44mm/px · z∈[-96,-62]mm · 3 of 87 slices shown]
[im 9/87  bone]
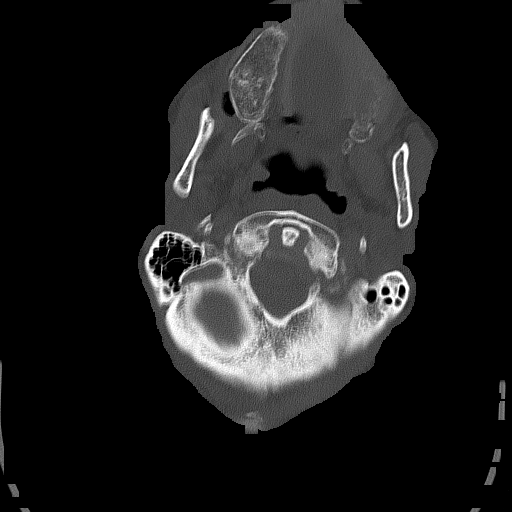
[im 18/87  bone]
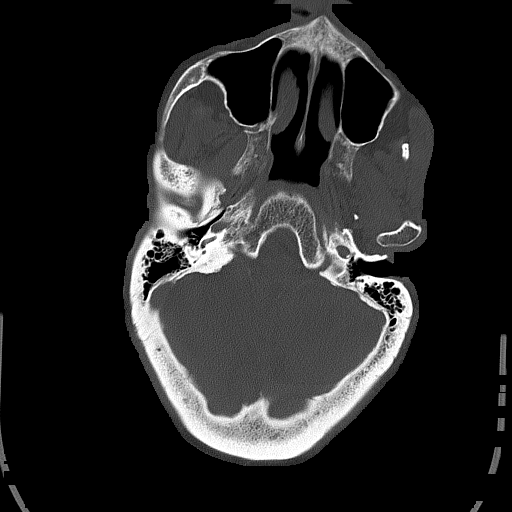
[im 26/87  bone]
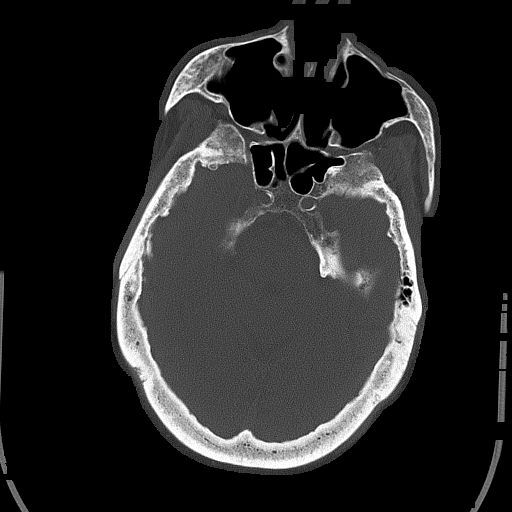

[Series 5: head without cor · coronal · non-contrast · 0.33mm/px · 3 of 70 slices shown]
[im 24/70  brain]
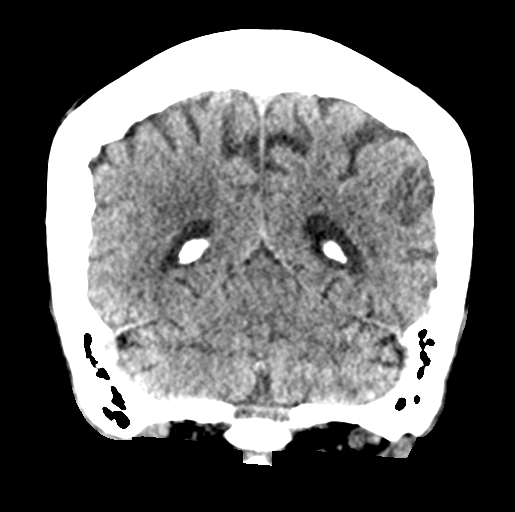
[im 31/70  brain]
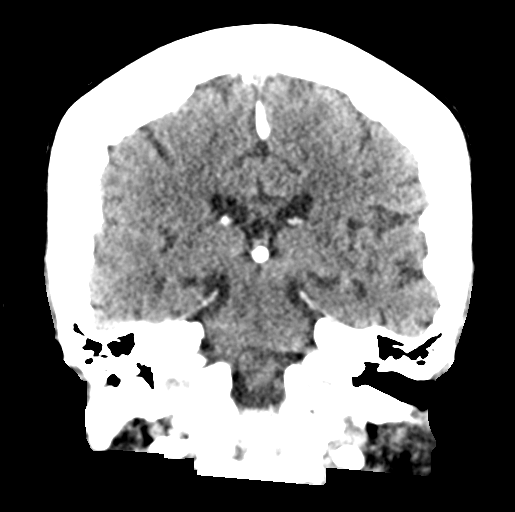
[im 39/70  brain]
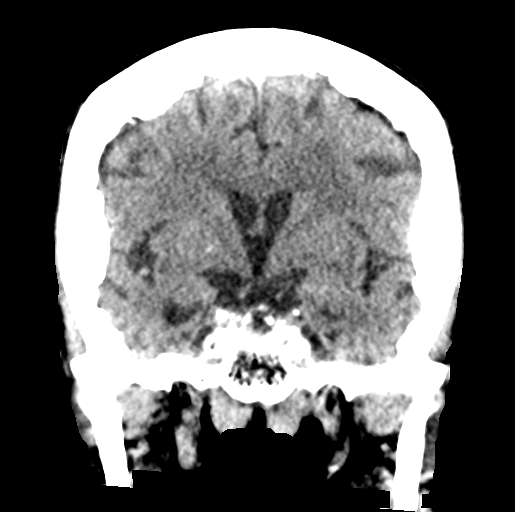

[Series 6: head without sag · sagittal · non-contrast · 0.32mm/px · 3 of 52 slices shown]
[im 18/52  brain]
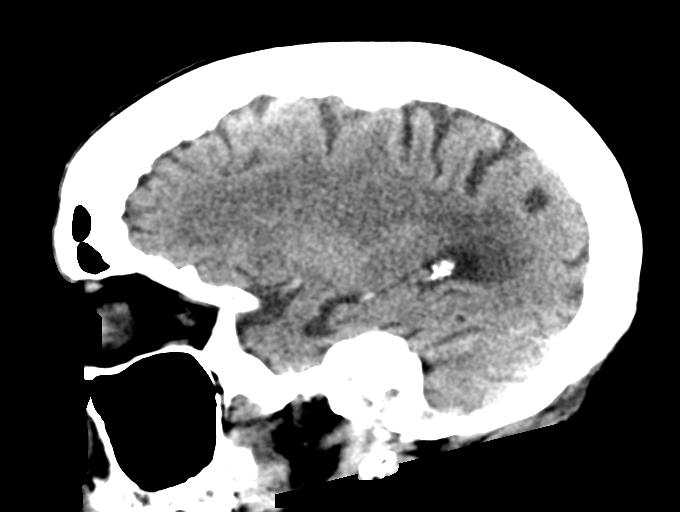
[im 26/52  brain]
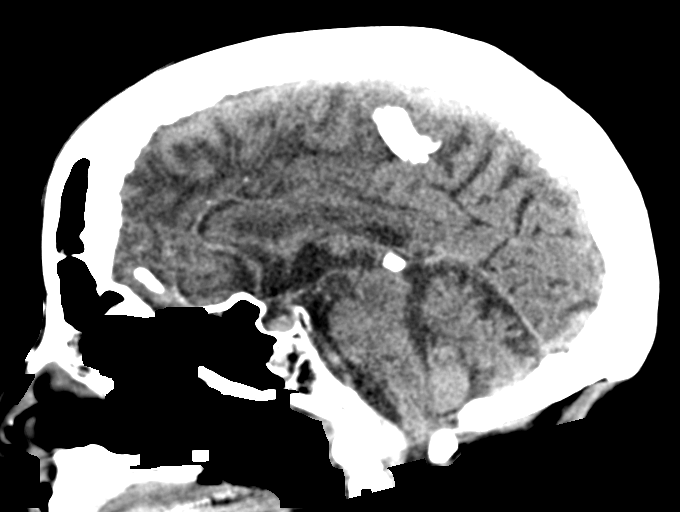
[im 35/52  brain]
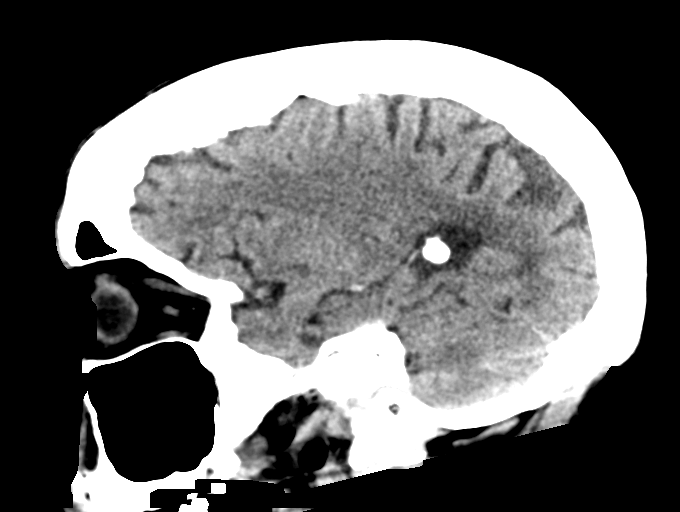

[16 of 47 positions shown; findings below may reference images not displayed]

FINDINGS: Brain: No acute intracranial hemorrhage, mass effect, or herniation.
No extra-axial fluid collections. No evidence of acute territorial
infarct. No hydrocephalus. Mild cortical volume loss. Patchy
hypodensities in the periventricular and subcortical white matter,
likely secondary to chronic microvascular ischemic changes.

Vascular: Calcified plaques in the carotid siphons.

Skull: Hyperostosis frontalis interna. No acute skull fracture
identified.

Sinuses/Orbits: No acute finding.

Other: None.
IMPRESSION: Chronic changes with no acute intracranial process identified.
